# Patient Record
Sex: Female | Born: 1995 | Race: Black or African American | Hispanic: No | Marital: Single | Smoking: Former smoker
Health system: Southern US, Community
[De-identification: ages and names within clinical notes are randomized; demographics above are authoritative.]

## PROBLEM LIST (undated history)

## (undated) ENCOUNTER — Inpatient Hospital Stay (HOSPITAL_COMMUNITY): Payer: Self-pay

## (undated) DIAGNOSIS — K219 Gastro-esophageal reflux disease without esophagitis: Secondary | ICD-10-CM

## (undated) DIAGNOSIS — K259 Gastric ulcer, unspecified as acute or chronic, without hemorrhage or perforation: Secondary | ICD-10-CM

## (undated) DIAGNOSIS — A749 Chlamydial infection, unspecified: Secondary | ICD-10-CM

## (undated) HISTORY — PX: MYRINGOTOMY: SUR874

---
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
## 2013-03-31 ENCOUNTER — Emergency Department (HOSPITAL_COMMUNITY)
Admission: EM | Admit: 2013-03-31 | Discharge: 2013-03-31 | Disposition: A | Discharge status: Home / Self Care | Payer: Medicaid Other | Attending: Emergency Medicine | Admitting: Emergency Medicine

## 2013-03-31 ENCOUNTER — Encounter (HOSPITAL_COMMUNITY): Payer: Self-pay | Admitting: Adult Health

## 2013-03-31 DIAGNOSIS — R51 Headache: Secondary | ICD-10-CM | POA: Insufficient documentation

## 2013-03-31 DIAGNOSIS — Z8719 Personal history of other diseases of the digestive system: Secondary | ICD-10-CM | POA: Insufficient documentation

## 2013-03-31 DIAGNOSIS — Z8679 Personal history of other diseases of the circulatory system: Secondary | ICD-10-CM | POA: Insufficient documentation

## 2013-03-31 DIAGNOSIS — Z87891 Personal history of nicotine dependence: Secondary | ICD-10-CM | POA: Insufficient documentation

## 2013-03-31 MED ORDER — IBUPROFEN 800 MG PO TABS
800.0000 mg | ORAL_TABLET | Freq: Once | ORAL | Status: AC
  Administered 2013-03-31: 800 mg via ORAL
  Filled 2013-03-31: qty 1

## 2013-03-31 MED ORDER — IBUPROFEN 800 MG PO TABS: 400.0000 mg | ORAL_TABLET | Freq: Four times a day (QID) | ORAL | Status: DC | PRN

## 2013-03-31 NOTE — ED Notes (Signed)
Presents with 10 days of intermittent headaches in the frontal lobe. Nothing makes pain better, nothing makes pain worse. Pain desribed as throbbing. Pt alert, oriented, maeX4. Denies nausea.

## 2013-03-31 NOTE — ED Notes (Signed)
Pt returned from radiology, scan cancelled.

## 2013-03-31 NOTE — ED Provider Notes (Signed)
History     CSN: [id number]  Arrival date & time 03/31/13  1718   First MD Initiated Contact with Patient 03/31/13 2028      Chief Complaint  Patient presents with  . Headache    (Consider location/radiation/quality/duration/timing/severity/associated sxs/prior treatment) HPI Comments: Patient is a 17 year old female who presents for intermittent headaches x 1 week. Patient states her headaches are throbbing in nature, without thunderclap onset, and nonradiating. Throbbing sensation is present in her bilateral temples without any aggravating or alleviating factors. Patient admits to taking Excedrin, Tylenol, and aspirin without relief. She denies fevers, vision changes, difficulty swallowing or speaking, neck pain or stiffness, photophobia, phonophobia, chest pain, shortness of breath, nausea or vomiting, numbness or tingling in her extremities, weakness or inability to ambulate, and recent head injury or trauma.  The history is provided by the patient. No language interpreter was used.    Past Medical History  Diagnosis Date  . Hypotension   . GERD (gastroesophageal reflux disease)   . Gastric ulcer     Past Surgical History  Procedure Laterality Date  . Myringotomy      History reviewed. No pertinent family history.  History  Substance Use Topics  . Smoking status: Former Games developer  . Smokeless tobacco: Not on file  . Alcohol Use: No    OB History   Grav Para Term Preterm Abortions TAB SAB Ect Mult Living                  Review of Systems  Neurological: Positive for headaches.  All other systems reviewed and are negative.   Allergies  Review of patient's allergies indicates no known allergies.  Home Medications   Current Outpatient Rx  Name  Route  Sig  Dispense  Refill  . ibuprofen (ADVIL,MOTRIN) 800 MG tablet   Oral   Take 0.5 tablets (400 mg total) by mouth every 6 (six) hours as needed for pain.   21 tablet   0     BP 108/64  Pulse 62  Temp(Src)  97.8 F (36.6 C) (Oral)  Resp 14  SpO2 98%  Physical Exam  Nursing note and vitals reviewed. Constitutional: She is oriented to person, place, and time. She appears well-developed and well-nourished. No distress.  Patient is calm and well appearing, in no acute distress. Patient states that pain is not bad at this time and that she did not require pain medicine. Patient laughing at her boyfriend's jokes.  HENT:  Head: Normocephalic and atraumatic.  Mouth/Throat: Oropharynx is clear and moist. No oropharyngeal exudate.  Eyes: Conjunctivae and EOM are normal. Pupils are equal, round, and reactive to light. No scleral icterus.  Neck: Normal range of motion. Neck supple.  Cardiovascular: Normal rate, regular rhythm, normal heart sounds and intact distal pulses.   Pulmonary/Chest: Effort normal and breath sounds normal. No respiratory distress. She has no wheezes. She has no rales.  Abdominal: Soft. Bowel sounds are normal. She exhibits no distension. There is no tenderness. There is no rebound and no guarding.  Musculoskeletal: Normal range of motion. She exhibits no edema and no tenderness.  Lymphadenopathy:    She has no cervical adenopathy.  Neurological: She is alert and oriented to person, place, and time. She has normal reflexes.  Cranial nerves II through XII grossly intact. Patient exhibits equal grip strength bilaterally with 5 out of 5 strength against resistance in her upper and lower extremities. No sensory or motor deficits appreciated. DTRs normal and symmetric. Rapid alternating movements  intact.  Skin: Skin is warm and dry. No rash noted. She is not diaphoretic. No erythema. No pallor.  Psychiatric: She has a normal mood and affect. Her behavior is normal.    ED Course  Procedures (including critical care time)  Labs Reviewed - No data to display No results found.   1. Headache     MDM  Patient presents for intermittent throbbing headaches times one week without  thunderclap onset. On physical exam patient is well and nontoxic appearing, laughing at her boyfriend's jokes, and in no discomfort. No focal neurologic deficits appreciated on physical exam; patient exhibits equal grip strength with normal strength against resistance, normal gait, symmetric reflexes, and normal RAMs. Patient is afebrile and there is no nuchal rigidity to suspect meningitis. Very low suspicion for hemorrhage as the cause of symptoms given patient's age, symptom duration, lack of neurologic deficits, and lack of head injury or trauma.   Patient given ibuprofen and ED with relief of symptoms. Patient is hemodynamically stable and appropriate for discharge with primary care followup; patient endorses relationship with a PCP. Patient given ibuprofen to take as needed for headaches and advised to refrain from drinking sugary drinks and to get between 6-8 hours of sleep a night. Indications for ED return discussed. Patient states comfort and understanding with this discharge plan with no unaddressed concerns.        Antony Madura, PA-C 04/04/13 [phone]

## 2013-03-31 NOTE — ED Notes (Addendum)
Pt c/o constant HA x 1.5 weeks. Pt sts she gets headaches often but usually never lasting this long. Sometimes HA is on one side other on both sides, throbbing feeling. Pt denies photophobia, noise making pain worse. Pt denies new medications. Pt reports she has tried tylenol and Excedrin, pt sts not much relief with either of those. Pt reports when she was smaller she would take Claritin for her headaches and it worked. Pt denies nasal drainage. C/o watery eyes X 3 days. Pt in nad, skin warm and dry, resp e/u.

## 2013-03-31 NOTE — ED Notes (Signed)
FIRST ROUNDS : PT. SITTING AT WAITING AREA WITH NO DISTRESS, RESPIRATIONS UNLABORED , NURSE EXPLAINED DELAY AND PROCESS.

## 2013-04-06 NOTE — ED Provider Notes (Signed)
Medical screening examination/treatment/procedure(s) were performed by non-physician practitioner and as supervising physician I was immediately available for consultation/collaboration.  Margret Moat L Tajanay Hurley, MD 04/06/13 0712 

## 2013-06-04 ENCOUNTER — Encounter (HOSPITAL_COMMUNITY): Payer: Self-pay | Admitting: Emergency Medicine

## 2013-06-04 ENCOUNTER — Emergency Department (INDEPENDENT_AMBULATORY_CARE_PROVIDER_SITE_OTHER)
Admission: EM | Admit: 2013-06-04 | Discharge: 2013-06-04 | Disposition: A | Discharge status: Home / Self Care | Payer: Medicaid Other | Source: Home / Self Care | Attending: Family Medicine | Admitting: Family Medicine

## 2013-06-04 ENCOUNTER — Other Ambulatory Visit (HOSPITAL_COMMUNITY)
Admission: RE | Admit: 2013-06-04 | Discharge: 2013-06-04 | Disposition: A | Discharge status: Home / Self Care | Payer: Medicaid Other | Source: Ambulatory Visit | Attending: Family Medicine | Admitting: Family Medicine

## 2013-06-04 DIAGNOSIS — Z113 Encounter for screening for infections with a predominantly sexual mode of transmission: Secondary | ICD-10-CM | POA: Insufficient documentation

## 2013-06-04 DIAGNOSIS — N760 Acute vaginitis: Secondary | ICD-10-CM | POA: Insufficient documentation

## 2013-06-04 DIAGNOSIS — N898 Other specified noninflammatory disorders of vagina: Secondary | ICD-10-CM

## 2013-06-04 DIAGNOSIS — K2970 Gastritis, unspecified, without bleeding: Secondary | ICD-10-CM

## 2013-06-04 DIAGNOSIS — R109 Unspecified abdominal pain: Secondary | ICD-10-CM

## 2013-06-04 DIAGNOSIS — K2990 Gastroduodenitis, unspecified, without bleeding: Secondary | ICD-10-CM

## 2013-06-04 HISTORY — DX: Chlamydial infection, unspecified: A74.9

## 2013-06-04 LAB — COMPREHENSIVE METABOLIC PANEL WITH GFR
ALT: 23 U/L (ref 0–35)
AST: 21 U/L (ref 0–37)
Albumin: 3.9 g/dL (ref 3.5–5.2)
Alkaline Phosphatase: 48 U/L (ref 39–117)
BUN: 9 mg/dL (ref 6–23)
CO2: 26 meq/L (ref 19–32)
Calcium: 9.3 mg/dL (ref 8.4–10.5)
Chloride: 103 meq/L (ref 96–112)
Creatinine, Ser: 0.79 mg/dL (ref 0.50–1.10)
GFR calc Af Amer: >90 mL/min
GFR calc non Af Amer: >90 mL/min
Glucose, Bld: 82 mg/dL (ref 70–99)
Potassium: 3.7 meq/L (ref 3.5–5.1)
Sodium: 137 meq/L (ref 135–145)
Total Bilirubin: 0.2 mg/dL — ABNORMAL LOW (ref 0.3–1.2)
Total Protein: 7.3 g/dL (ref 6.0–8.3)

## 2013-06-04 LAB — POCT URINALYSIS DIP (DEVICE)
Bilirubin Urine: NEGATIVE
Glucose, UA: NEGATIVE mg/dL
Hgb urine dipstick: NEGATIVE
Ketones, ur: NEGATIVE mg/dL
Nitrite: NEGATIVE
Protein, ur: NEGATIVE mg/dL
Specific Gravity, Urine: 1.01 (ref 1.005–1.030)
Urobilinogen, UA: 0.2 mg/dL (ref 0.0–1.0)
pH: 7 (ref 5.0–8.0)

## 2013-06-04 LAB — POCT PREGNANCY, URINE: Preg Test, Ur: NEGATIVE

## 2013-06-04 MED ORDER — ALUM & MAG HYDROXIDE-SIMETH 400-400-40 MG/5ML PO SUSP: 10.0000 mL | Freq: Four times a day (QID) | ORAL | Status: DC | PRN

## 2013-06-04 MED ORDER — METRONIDAZOLE 500 MG PO TABS: 500.0000 mg | ORAL_TABLET | Freq: Two times a day (BID) | ORAL | Status: DC

## 2013-06-04 MED ORDER — OMEPRAZOLE 20 MG PO CPDR: 20.0000 mg | DELAYED_RELEASE_CAPSULE | Freq: Every day | ORAL | Status: DC

## 2013-06-04 NOTE — ED Notes (Signed)
Patient in gown and equipment at beds

## 2013-06-04 NOTE — ED Provider Notes (Signed)
History    CSN: [id number] Arrival date & time 06/04/13  1138  First MD Initiated Contact with Patient 06/04/13 1347     Chief Complaint  Patient presents with  . Abdominal Pain   (Consider location/radiation/quality/duration/timing/severity/associated sxs/prior Treatment) HPI Comments: Patient presents complaining of left upper quadrant abdominal pain constant for about the past week. The pain is dull with intermittent stabbing sensation that is localized to the left upper quadrant only without any radiation or associated symptoms.  She reports that eating makes this pain worse. She has had this pain intermittently before in the past and has been diagnosed with a stomach ulcer. Denies any dark stools, nausea, vomiting, hematemesis, or abdominal pain elsewhere. She feels the pain is tight and stabbing that alternates with a bloated feeling. Also, she is complaining of some thin white vaginal discharge that she thinks may be normal but she wants to make sure. She was diagnosed with Chlamydia one month ago, treated by her primary care physician. She denies any pelvic pain,  dysuria, vaginal irritation. She is sexually active.  Patient is a 17 y.o. female presenting with abdominal pain.  Abdominal Pain Associated symptoms include abdominal pain. Pertinent negatives include no chest pain and no shortness of breath.   Past Medical History  Diagnosis Date  . Hypotension   . GERD (gastroesophageal reflux disease)   . Gastric ulcer   . Chlamydia    Past Surgical History  Procedure Laterality Date  . Myringotomy     No family history on file. History  Substance Use Topics  . Smoking status: Former Games developer  . Smokeless tobacco: Not on file  . Alcohol Use: No   OB History   Grav Para Term Preterm Abortions TAB SAB Ect Mult Living                 Review of Systems  Constitutional: Negative for fever and chills.  Eyes: Negative for visual disturbance.  Respiratory: Negative for cough  and shortness of breath.   Cardiovascular: Negative for chest pain, palpitations and leg swelling.  Gastrointestinal: Positive for abdominal pain, constipation and abdominal distention. Negative for nausea, vomiting, diarrhea, blood in stool, anal bleeding and rectal pain.  Endocrine: Negative for polydipsia and polyuria.  Genitourinary: Positive for vaginal discharge. Negative for dysuria, urgency, frequency, hematuria, flank pain, decreased urine volume, vaginal bleeding, difficulty urinating, genital sores, vaginal pain, pelvic pain and dyspareunia.  Musculoskeletal: Negative for myalgias and arthralgias.  Skin: Negative for rash.  Neurological: Negative for dizziness, weakness and light-headedness.    Allergies  Review of patient's allergies indicates no known allergies.  Home Medications   Current Outpatient Rx  Name  Route  Sig  Dispense  Refill  . alum & mag hydroxide-simeth (MAALOX PLUS) 400-400-40 MG/5ML suspension   Oral   Take 10 mLs by mouth every 6 (six) hours as needed for indigestion.   360 mL   0   . ibuprofen (ADVIL,MOTRIN) 800 MG tablet   Oral   Take 0.5 tablets (400 mg total) by mouth every 6 (six) hours as needed for pain.   21 tablet   0   . metroNIDAZOLE (FLAGYL) 500 MG tablet   Oral   Take 1 tablet (500 mg total) by mouth 2 (two) times daily.   14 tablet   0   . omeprazole (PRILOSEC) 20 MG capsule   Oral   Take 1 capsule (20 mg total) by mouth daily.   30 capsule   2  BP 115/65  Pulse 63  Temp(Src) 98.3 F (36.8 C) (Oral)  Resp 24  SpO2 100%  LMP 04/04/2013 Physical Exam  Nursing note and vitals reviewed. Constitutional: She is oriented to person, place, and time. Vital signs are normal. She appears well-developed and well-nourished. No distress.  HENT:  Head: Atraumatic.  Eyes: EOM are normal. Pupils are equal, round, and reactive to light.  Pulmonary/Chest: Effort normal.  Abdominal: There is tenderness in the right lower quadrant,  epigastric area and left upper quadrant.  Genitourinary: Vaginal discharge (thin white) found.  Neurological: She is alert and oriented to person, place, and time. She has normal strength.  Skin: Skin is warm and dry. She is not diaphoretic.  Psychiatric: She has a normal mood and affect. Her behavior is normal. Judgment normal.    ED Course  Procedures (including critical care time) Labs Reviewed  COMPREHENSIVE METABOLIC PANEL - Abnormal; Notable for the following:    Total Bilirubin 0.2 (*)    All other components within normal limits  POCT URINALYSIS DIP (DEVICE) - Abnormal; Notable for the following:    Leukocytes, UA TRACE (*)    All other components within normal limits  URINE CULTURE  POCT PREGNANCY, URINE  POCT PREGNANCY, URINE  POCT H PYLORI SCREEN  CERVICOVAGINAL ANCILLARY ONLY   No results found. 1. Gastritis   2. Abdominal pain   3. Vaginal discharge     MDM  I believe this patient has gastritis. The vital signs are normal and the pain is moderate. The abdominal exam is benign. Labs are normal as well. Treat with Maalox symptomatically and will start her on omeprazole daily, she will followup with her primary care physician. If the pain gets worse she will go to the emergency department. Vaginal discharge is thin white and malodorous, appears to be bacterial vaginosis. Swabs were sent, will start Flagyl.   Meds ordered this encounter  Medications  . omeprazole (PRILOSEC) 20 MG capsule    Sig: Take 1 capsule (20 mg total) by mouth daily.    Dispense:  30 capsule    Refill:  2  . metroNIDAZOLE (FLAGYL) 500 MG tablet    Sig: Take 1 tablet (500 mg total) by mouth 2 (two) times daily.    Dispense:  14 tablet    Refill:  0  . alum & mag hydroxide-simeth (MAALOX PLUS) 400-400-40 MG/5ML suspension    Sig: Take 10 mLs by mouth every 6 (six) hours as needed for indigestion.    Dispense:  360 mL    Refill:  0     Graylon Good, PA-C 06/04/13 2101

## 2013-06-04 NOTE — ED Notes (Signed)
Reports intermittent abdominal pain for one week, no nausea, no vomiting, no diarrhea.  Reports eating makes abdominal pain worse.  Vague, not certain patient has noticed change in urination or vaginal discharge: for both sets of questions patient seemed to be indecisive.  Patient reports last bowel movement was Tuesday and somewhat hard stool per patient

## 2013-06-05 NOTE — ED Notes (Addendum)
Affirm: Gardnerella pos.., Candida and Trich neg., Urine culture pending. Pt. adequately treated with Flagyl. Regina Shaffer 06/05/2013 GC/Chlamydia neg., Urine culture: 30,000 colonies Multiple bacterial types none predominant. 06/08/2013

## 2013-06-05 NOTE — ED Provider Notes (Signed)
Medical screening examination/treatment/procedure(s) were performed by non-physician practitioner and as supervising physician I was immediately available for consultation/collaboration.   MORENO-COLL,Skyra Crichlow; MD  Malory Spurr Moreno-Coll, MD 06/05/13 0758 

## 2013-06-06 LAB — URINE CULTURE

## 2014-11-16 ENCOUNTER — Encounter (HOSPITAL_COMMUNITY): Payer: Self-pay | Admitting: Emergency Medicine

## 2014-11-16 ENCOUNTER — Emergency Department (HOSPITAL_COMMUNITY)
Admission: EM | Admit: 2014-11-16 | Discharge: 2014-11-16 | Disposition: A | Discharge status: Home / Self Care | Payer: Medicaid Other | Attending: Emergency Medicine | Admitting: Emergency Medicine

## 2014-11-16 DIAGNOSIS — Z8619 Personal history of other infectious and parasitic diseases: Secondary | ICD-10-CM | POA: Insufficient documentation

## 2014-11-16 DIAGNOSIS — Z79899 Other long term (current) drug therapy: Secondary | ICD-10-CM | POA: Diagnosis not present

## 2014-11-16 DIAGNOSIS — K219 Gastro-esophageal reflux disease without esophagitis: Secondary | ICD-10-CM | POA: Insufficient documentation

## 2014-11-16 DIAGNOSIS — R519 Headache, unspecified: Secondary | ICD-10-CM

## 2014-11-16 DIAGNOSIS — R51 Headache: Secondary | ICD-10-CM | POA: Insufficient documentation

## 2014-11-16 DIAGNOSIS — Z8679 Personal history of other diseases of the circulatory system: Secondary | ICD-10-CM | POA: Insufficient documentation

## 2014-11-16 DIAGNOSIS — Z87891 Personal history of nicotine dependence: Secondary | ICD-10-CM | POA: Diagnosis not present

## 2014-11-16 MED ORDER — METOCLOPRAMIDE HCL 5 MG/ML IJ SOLN
10.0000 mg | Freq: Once | INTRAMUSCULAR | Status: AC
  Administered 2014-11-16: 10 mg via INTRAVENOUS
  Filled 2014-11-16: qty 2

## 2014-11-16 MED ORDER — DIPHENHYDRAMINE HCL 50 MG/ML IJ SOLN
25.0000 mg | Freq: Once | INTRAMUSCULAR | Status: AC
  Administered 2014-11-16: 25 mg via INTRAVENOUS
  Filled 2014-11-16: qty 1

## 2014-11-16 MED ORDER — KETOROLAC TROMETHAMINE 30 MG/ML IJ SOLN
30.0000 mg | Freq: Once | INTRAMUSCULAR | Status: AC
  Administered 2014-11-16: 30 mg via INTRAVENOUS
  Filled 2014-11-16: qty 1

## 2014-11-16 NOTE — ED Notes (Signed)
Per EMS- Patient has a history of migraines with pain across forehead. Patient has been prescribed meds from her physician, but did not take in the past 2 days because she did not think they were working. Patient c/o right forehead punding today. Patient c/o right knee weakness and right ankle tightness.

## 2014-11-16 NOTE — ED Provider Notes (Signed)
CSN: [id number]     Arrival date & time 11/16/14  1504 History   First MD Initiated Contact with Patient 11/16/14 1510     Chief Complaint  Patient presents with  . Headache     (Consider location/radiation/quality/duration/timing/severity/associated sxs/prior Treatment) HPI Comments: Patient presents today with a chief complaint of headache.  Headache is frontal and does not radiate.  She reports that the headache has been constant since waking up around 8 AM this morning and is gradually worsening.  She has not taken anything for pain prior to arrival.  She denies nausea, vomiting, vision changes, neck pain/stiffness, fever, or chills.  She denies any head injury or trauma.  Headache is similar to the headaches that she has had in the past.  The history is provided by the patient.    Past Medical History  Diagnosis Date  . Hypotension   . GERD (gastroesophageal reflux disease)   . Gastric ulcer   . Chlamydia    Past Surgical History  Procedure Laterality Date  . Myringotomy     No family history on file. History  Substance Use Topics  . Smoking status: Former Games developermoker  . Smokeless tobacco: Not on file  . Alcohol Use: No   OB History    No data available     Review of Systems  All other systems reviewed and are negative.     Allergies  Review of patient's allergies indicates no known allergies.  Home Medications   Prior to Admission medications   Medication Sig Start Date End Date Taking? Authorizing Provider  cyclobenzaprine (FLEXERIL) 5 MG tablet Take 1 tablet by mouth 3 (three) times daily as needed. Headaches 08/16/14  Yes Historical Provider, MD  diclofenac (VOLTAREN) 75 MG EC tablet Take 75 mg by mouth 2 (two) times daily as needed. headaches 08/16/14  Yes Historical Provider, MD  alum & mag hydroxide-simeth (MAALOX PLUS) 400-400-40 MG/5ML suspension Take 10 mLs by mouth every 6 (six) hours as needed for indigestion. Patient not taking: Reported on 11/16/2014  06/04/13   Graylon GoodZachary H Baker, PA-C  ibuprofen (ADVIL,MOTRIN) 800 MG tablet Take 0.5 tablets (400 mg total) by mouth every 6 (six) hours as needed for pain. Patient not taking: Reported on 11/16/2014 03/31/13   Antony MaduraKelly Humes, PA-C  metroNIDAZOLE (FLAGYL) 500 MG tablet Take 1 tablet (500 mg total) by mouth 2 (two) times daily. Patient not taking: Reported on 11/16/2014 06/04/13   Graylon GoodZachary H Baker, PA-C  omeprazole (PRILOSEC) 20 MG capsule Take 1 capsule (20 mg total) by mouth daily. Patient not taking: Reported on 11/16/2014 06/04/13   Graylon GoodZachary H Baker, PA-C   BP 103/55 mmHg  Pulse 86  Temp(Src) 98.2 F (36.8 C) (Oral)  Resp 18  SpO2 98% Physical Exam  Constitutional: She appears well-developed and well-nourished.  HENT:  Head: Normocephalic and atraumatic.  Mouth/Throat: Oropharynx is clear and moist.  Eyes: EOM are normal. Pupils are equal, round, and reactive to light.  Neck: Normal range of motion. Neck supple.  Cardiovascular: Normal rate, regular rhythm and normal heart sounds.   Pulmonary/Chest: Effort normal and breath sounds normal.  Musculoskeletal: Normal range of motion.  Neurological: She is alert. She has normal strength. No cranial nerve deficit or sensory deficit. Coordination and gait normal.  Normal rapid alternating movements Normal finger to nose testing Normal gait, no ataxia  Skin: Skin is warm and dry.  Psychiatric: She has a normal mood and affect.  Nursing note and vitals reviewed.   ED Course  Procedures (including critical care time) Labs Review Labs Reviewed - No data to display  Imaging Review No results found.   EKG Interpretation None     4:45 PM Reassessed patient.  Headache improved. MDM   Final diagnoses:  None   Pt HA treated and improved while in ED.  Presentation is like pts typical HA and non concerning for [facility], ICH, Meningitis, or temporal arteritis. Pt is afebrile with no focal neuro deficits, nuchal rigidity, or change in vision. Pt is  to follow up with Neurology to discuss prophylactic medication. Pt verbalizes understanding and is agreeable with plan to dc.       Santiago GladHeather Lizzie An, PA-C 11/17/14 1519  Rolland PorterMark James, MD 11/25/14 [phone]

## 2014-11-16 NOTE — ED Notes (Signed)
Bed: [id number] Expected date:  Expected time:  Means of arrival:  Comments: EMS- 18yo F, headache, Hx of same

## 2015-09-13 ENCOUNTER — Emergency Department (HOSPITAL_COMMUNITY)
Admission: EM | Admit: 2015-09-13 | Discharge: 2015-09-13 | Disposition: A | Discharge status: Home / Self Care | Payer: Medicaid Other | Attending: Emergency Medicine | Admitting: Emergency Medicine

## 2015-09-13 ENCOUNTER — Encounter (HOSPITAL_COMMUNITY): Payer: Self-pay

## 2015-09-13 DIAGNOSIS — Z8679 Personal history of other diseases of the circulatory system: Secondary | ICD-10-CM | POA: Insufficient documentation

## 2015-09-13 DIAGNOSIS — Z87891 Personal history of nicotine dependence: Secondary | ICD-10-CM | POA: Insufficient documentation

## 2015-09-13 DIAGNOSIS — E669 Obesity, unspecified: Secondary | ICD-10-CM | POA: Insufficient documentation

## 2015-09-13 DIAGNOSIS — Z8619 Personal history of other infectious and parasitic diseases: Secondary | ICD-10-CM | POA: Insufficient documentation

## 2015-09-13 DIAGNOSIS — H109 Unspecified conjunctivitis: Secondary | ICD-10-CM

## 2015-09-13 DIAGNOSIS — Z8719 Personal history of other diseases of the digestive system: Secondary | ICD-10-CM | POA: Insufficient documentation

## 2015-09-13 DIAGNOSIS — J029 Acute pharyngitis, unspecified: Secondary | ICD-10-CM

## 2015-09-13 LAB — RAPID STREP SCREEN (MED CTR MEBANE ONLY): STREPTOCOCCUS, GROUP A SCREEN (DIRECT): NEGATIVE

## 2015-09-13 MED ORDER — SULFACETAMIDE SODIUM 10 % OP SOLN: 1.0000 [drp] | OPHTHALMIC | Status: DC

## 2015-09-13 MED ORDER — OMEPRAZOLE 20 MG PO CPDR: 20.0000 mg | DELAYED_RELEASE_CAPSULE | Freq: Every day | ORAL | Status: DC

## 2015-09-13 NOTE — ED Notes (Signed)
Patient states she has been having a sore throat for the past 3 weeks.  States she has been taking BC's and hot tea with honey and lemon with no relief.  Patient states productive cough of yellow mucous, blood tinged.  Patient also states left eye pain and irritation, states green drainage out of left eye since yesterday.  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  Patients states she was placed on Diamox, but has only taken 2 tablets "because I forgot about it".

## 2015-09-13 NOTE — ED Provider Notes (Signed)
CSN: [id number]     Arrival date & time 09/13/15  0303 History   First MD Initiated Contact with Patient 09/13/15 [phone]     Chief Complaint  Patient presents with  . Sore Throat  . Eye Pain     (Consider location/radiation/quality/duration/timing/severity/associated sxs/prior Treatment) HPI The patient reports she's had a sore throat from his 3 weeks. It is worse at times. She reports especially when she wakes up in the morning she may have a lot of discomfort and pain with swallowing. She reports that she drinks tea through a lot of the day it feels fine, but the next morning when she wakes up it may be very sore again. She denies any difficulty breathing. She reports she is occasionally had some cough. She reports sometimes his mucus in it. She does not have shortness of breath or chest pain. She denies vomiting but she reports she's always had "stomach problems". She reports she doesn't think about it much anymore. She used to take Prilosec but hasn't taken any for over a year. She just started with a red and itchy eye that is draining yesterday. This is the left eye. She reports she has some pre-existing blurring in the vision, the vision has not changed since these symptoms develop. Past Medical History  Diagnosis Date  . Hypotension   . GERD (gastroesophageal reflux disease)   . Gastric ulcer   . Chlamydia    Past Surgical History  Procedure Laterality Date  . Myringotomy     No family history on file. Social History  Substance Use Topics  . Smoking status: Former Games developermoker  . Smokeless tobacco: None  . Alcohol Use: No   OB History    No data available     Review of Systems  10 Systems reviewed and are negative for acute change except as noted in the HPI.   Allergies  Review of patient's allergies indicates no known allergies.  Home Medications   Prior to Admission medications   Medication Sig Start Date End Date Taking? Authorizing Provider   Aspirin-Salicylamide-Caffeine (BC HEADACHE) 325-95-16 MG TABS Take 1 each by mouth every 6 (six) hours as needed (pain).   Yes Historical Provider, MD  alum & mag hydroxide-simeth (MAALOX PLUS) 400-400-40 MG/5ML suspension Take 10 mLs by mouth every 6 (six) hours as needed for indigestion. Patient not taking: Reported on 11/16/2014 06/04/13   Graylon GoodZachary H Baker, PA-C  ibuprofen (ADVIL,MOTRIN) 800 MG tablet Take 0.5 tablets (400 mg total) by mouth every 6 (six) hours as needed for pain. Patient not taking: Reported on 11/16/2014 03/31/13   Antony MaduraKelly Humes, PA-C  metroNIDAZOLE (FLAGYL) 500 MG tablet Take 1 tablet (500 mg total) by mouth 2 (two) times daily. Patient not taking: Reported on 11/16/2014 06/04/13   Graylon GoodZachary H Baker, PA-C  omeprazole (PRILOSEC) 20 MG capsule Take 1 capsule (20 mg total) by mouth daily. Patient not taking: Reported on 11/16/2014 06/04/13   Graylon GoodZachary H Baker, PA-C  omeprazole (PRILOSEC) 20 MG capsule Take 1 capsule (20 mg total) by mouth daily. 09/13/15   Arby BarretteMarcy Muath Hallam, MD  sulfacetamide (BLEPH-10) 10 % ophthalmic solution Place 1-2 drops into the left eye every 4 (four) hours. 09/13/15   Arby BarretteMarcy Terianna Peggs, MD   BP 119/85 mmHg  Pulse 88  Temp(Src) 97.9 F (36.6 C) (Oral)  Resp 16  Ht 5' (1.524 m)  Wt 260 lb (117.935 kg)  BMI 50.78 kg/m2  SpO2 99%  LMP 09/08/2015 Physical Exam  Constitutional: She is oriented to person,  place, and time.  Patient is alert and nontoxic. She is moderately obese. No respiratory distress. Mental status is clear.  HENT:  Head: Normocephalic and atraumatic.  Right Ear: External ear normal.  Left Ear: External ear normal.  Nose: Nose normal.  Mouth/Throat: Oropharynx is clear and moist.  Posterior or first is widely patent. There is no exudate or erythema on the tonsils.  Eyes: EOM are normal. Pupils are equal, round, and reactive to light.  Left conjunctiva is inflamed. Patient has yellow matting in the lashes. There is no proptosis. There is no  periorbital swelling.  Neck: Neck supple.  Neck is supple without lymphadenopathy or mass.  Cardiovascular: Normal rate, regular rhythm, normal heart sounds and intact distal pulses.   Pulmonary/Chest: Effort normal and breath sounds normal.  Abdominal: Soft. Bowel sounds are normal. She exhibits no distension. There is no tenderness.  Musculoskeletal: Normal range of motion.  Neurological: She is alert and oriented to person, place, and time. She has normal strength. No cranial nerve deficit. She exhibits normal muscle tone. Coordination normal. GCS eye subscore is 4. GCS verbal subscore is 5. GCS motor subscore is 6.  Skin: Skin is warm, dry and intact.  Psychiatric: She has a normal mood and affect.    ED Course  Procedures (including critical care time) Labs Review Labs Reviewed  RAPID STREP SCREEN (NOT AT [facility])  CULTURE, GROUP A STREP    Imaging Review No results found. I have personally reviewed and evaluated these images and lab results as part of my medical decision-making.   EKG Interpretation None      MDM   Final diagnoses:  Conjunctivitis, left eye  Pharyngitis   Patient's had 3 weeks of sore throat. She notes sometimes is worse in the morning. It is improved by taking liquids through the day. Her throat exam is normal. By history the patient likely has GERD. She has not been on any treatment for that for over a year. With intermittent sore throat that is perceived predominantly in the hypopharyngeal region, I felt first course of treatment should be to reinstitute her Prilosec. Patient is counseled on recheck within the next couple weeks to see if this improves her symptoms. She is counseled on possible follow-up with ENT if symptoms are persisting despite this treatment. She incidentally has contacted by this which appears to be bacterial that started within the past 1-2 days. She'll be treated with Bleph-10 for this.    Arby Barrette, MD 09/13/15 [phone]

## 2015-09-13 NOTE — Discharge Instructions (Signed)
Bacterial Conjunctivitis Bacterial conjunctivitis, commonly called pink eye, is an inflammation of the clear membrane that covers the white part of the eye (conjunctiva). The inflammation can also happen on the underside of the eyelids. The blood vessels in the conjunctiva become inflamed, causing the eye to become red or pink. Bacterial conjunctivitis may spread easily from one eye to another and from person to person (contagious).  CAUSES  Bacterial conjunctivitis is caused by bacteria. The bacteria may come from your own skin, your upper respiratory tract, or from someone else with bacterial conjunctivitis. SYMPTOMS  The normally white color of the eye or the underside of the eyelid is usually pink or red. The pink eye is usually associated with irritation, tearing, and some sensitivity to light. Bacterial conjunctivitis is often associated with a thick, yellowish discharge from the eye. The discharge may turn into a crust on the eyelids overnight, which causes your eyelids to stick together. If a discharge is present, there may also be some blurred vision in the affected eye. DIAGNOSIS  Bacterial conjunctivitis is diagnosed by your caregiver through an eye exam and the symptoms that you report. Your caregiver looks for changes in the surface tissues of your eyes, which may point to the specific type of conjunctivitis. A sample of any discharge may be collected on a cotton-tip swab if you have a severe case of conjunctivitis, if your cornea is affected, or if you keep getting repeat infections that do not respond to treatment. The sample will be sent to a lab to see if the inflammation is caused by a bacterial infection and to see if the infection will respond to antibiotic medicines. TREATMENT   Bacterial conjunctivitis is treated with antibiotics. Antibiotic eyedrops are most often used. However, antibiotic ointments are also available. Antibiotics pills are sometimes used. Artificial tears or eye  washes may ease discomfort. HOME CARE INSTRUCTIONS   To ease discomfort, apply a cool, clean washcloth to your eye for 10-20 minutes, 3-4 times a day.  Gently wipe away any drainage from your eye with a warm, wet washcloth or a cotton ball.  Wash your hands often with soap and water. Use paper towels to dry your hands.  Do not share towels or washcloths. This may spread the infection.  Change or wash your pillowcase every day.  You should not use eye makeup until the infection is gone.  Do not operate machinery or drive if your vision is blurred.  Stop using contact lenses. Ask your caregiver how to sterilize or replace your contacts before using them again. This depends on the type of contact lenses that you use.  When applying medicine to the infected eye, do not touch the edge of your eyelid with the eyedrop bottle or ointment tube. SEEK IMMEDIATE MEDICAL CARE IF:   Your infection has not improved within 3 days after beginning treatment.  You had yellow discharge from your eye and it returns.  You have increased eye pain.  Your eye redness is spreading.  Your vision becomes blurred.  You have a fever or persistent symptoms for more than 2-3 days.  You have a fever and your symptoms suddenly get worse.  You have facial pain, redness, or swelling. MAKE SURE YOU:   Understand these instructions.  Will watch your condition.  Will get help right away if you are not doing well or get worse.   This information is not intended to replace advice given to you by your health care provider. Make sure you   discuss any questions you have with your health care provider.   Document Released: 11/12/2005 Document Revised: 12/03/2014 Document Reviewed: 04/14/2012 Elsevier Interactive Patient Education 2016 Elsevier Inc.  Pharyngitis Pharyngitis is redness, pain, and swelling (inflammation) of your pharynx.  CAUSES  Pharyngitis is usually caused by infection. Most of the time,  these infections are from viruses (viral) and are part of a cold. However, sometimes pharyngitis is caused by bacteria (bacterial). Pharyngitis can also be caused by allergies. Viral pharyngitis may be spread from person to person by coughing, sneezing, and personal items or utensils (cups, forks, spoons, toothbrushes). Bacterial pharyngitis may be spread from person to person by more intimate contact, such as kissing.  SIGNS AND SYMPTOMS  Symptoms of pharyngitis include:   Sore throat.   Tiredness (fatigue).   Low-grade fever.   Headache.  Joint pain and muscle aches.  Skin rashes.  Swollen lymph nodes.  Plaque-like film on throat or tonsils (often seen with bacterial pharyngitis). DIAGNOSIS  Your health care provider will ask you questions about your illness and your symptoms. Your medical history, along with a physical exam, is often all that is needed to diagnose pharyngitis. Sometimes, a rapid strep test is done. Other lab tests may also be done, depending on the suspected cause.  TREATMENT  Viral pharyngitis will usually get better in 3-4 days without the use of medicine. Bacterial pharyngitis is treated with medicines that kill germs (antibiotics).  HOME CARE INSTRUCTIONS   Drink enough water and fluids to keep your urine clear or pale yellow.   Only take over-the-counter or prescription medicines as directed by your health care provider:   If you are prescribed antibiotics, make sure you finish them even if you start to feel better.   Do not take aspirin.   Get lots of rest.   Gargle with 8 oz of salt water ( tsp of salt per 1 qt of water) as often as every 1-2 hours to soothe your throat.   Throat lozenges (if you are not at risk for choking) or sprays may be used to soothe your throat. SEEK MEDICAL CARE IF:   You have large, tender lumps in your neck.  You have a rash.  You cough up green, yellow-brown, or bloody spit. SEEK IMMEDIATE MEDICAL CARE IF:     Your neck becomes stiff.  You drool or are unable to swallow liquids.  You vomit or are unable to keep medicines or liquids down.  You have severe pain that does not go away with the use of recommended medicines.  You have trouble breathing (not caused by a stuffy nose). MAKE SURE YOU:   Understand these instructions.  Will watch your condition.  Will get help right away if you are not doing well or get worse.   This information is not intended to replace advice given to you by your health care provider. Make sure you discuss any questions you have with your health care provider.   Document Released: 11/12/2005 Document Revised: 09/02/2013 Document Reviewed: 07/20/2013 Elsevier Interactive Patient Education 2016 Elsevier Inc. Gastroesophageal Reflux Disease, Adult Normally, food travels down the esophagus and stays in the stomach to be digested. However, when a person has gastroesophageal reflux disease (GERD), food and stomach acid move back up into the esophagus. When this happens, the esophagus becomes sore and inflamed. Over time, GERD can create small holes (ulcers) in the lining of the esophagus.  CAUSES This condition is caused by a problem with the muscle between the  esophagus and the stomach (lower esophageal sphincter, or LES). Normally, the LES muscle closes after food passes through the esophagus to the stomach. When the LES is weakened or abnormal, it does not close properly, and that allows food and stomach acid to go back up into the esophagus. The LES can be weakened by certain dietary substances, medicines, and medical conditions, including:  Tobacco use.  Pregnancy.  Having a hiatal hernia.  Heavy alcohol use.  Certain foods and beverages, such as coffee, chocolate, onions, and peppermint. RISK FACTORS This condition is more likely to develop in:  People who have an increased body weight.  People who have connective tissue disorders.  People who use  NSAID medicines. SYMPTOMS Symptoms of this condition include:  Heartburn.  Difficult or painful swallowing.  The feeling of having a lump in the throat.  Abitter taste in the mouth.  Bad breath.  Having a large amount of saliva.  Having an upset or bloated stomach.  Belching.  Chest pain.  Shortness of breath or wheezing.  Ongoing (chronic) cough or a night-time cough.  Wearing away of tooth enamel.  Weight loss. Different conditions can cause chest pain. Make sure to see your health care provider if you experience chest pain. DIAGNOSIS Your health care provider will take a medical history and perform a physical exam. To determine if you have mild or severe GERD, your health care provider may also monitor how you respond to treatment. You may also have other tests, including:  An endoscopy toexamine your stomach and esophagus with a small camera.  A test thatmeasures the acidity level in your esophagus.  A test thatmeasures how much pressure is on your esophagus.  A barium swallow or modified barium swallow to show the shape, size, and functioning of your esophagus. TREATMENT The goal of treatment is to help relieve your symptoms and to prevent complications. Treatment for this condition may vary depending on how severe your symptoms are. Your health care provider may recommend:  Changes to your diet.  Medicine.  Surgery. HOME CARE INSTRUCTIONS Diet  Follow a diet as recommended by your health care provider. This may involve avoiding foods and drinks such as:  Coffee and tea (with or without caffeine).  Drinks that containalcohol.  Energy drinks and sports drinks.  Carbonated drinks or sodas.  Chocolate and cocoa.  Peppermint and mint flavorings.  Garlic and onions.  Horseradish.  Spicy and acidic foods, including peppers, chili powder, curry powder, vinegar, hot sauces, and barbecue sauce.  Citrus fruit juices and citrus fruits, such as  oranges, lemons, and limes.  Tomato-based foods, such as red sauce, chili, salsa, and pizza with red sauce.  Fried and fatty foods, such as donuts, french fries, potato chips, and high-fat dressings.  High-fat meats, such as hot dogs and fatty cuts of red and white meats, such as rib eye steak, sausage, ham, and bacon.  High-fat dairy items, such as whole milk, butter, and cream cheese.  Eat small, frequent meals instead of large meals.  Avoid drinking large amounts of liquid with your meals.  Avoid eating meals during the 2-3 hours before bedtime.  Avoid lying down right after you eat.  Do not exercise right after you eat. General Instructions  Pay attention to any changes in your symptoms.  Take over-the-counter and prescription medicines only as told by your health care provider. Do not take aspirin, ibuprofen, or other NSAIDs unless your health care provider told you to do so.  Do not  use any tobacco products, including cigarettes, chewing tobacco, and e-cigarettes. If you need help quitting, ask your health care provider.  Wear loose-fitting clothing. Do not wear anything tight around your waist that causes pressure on your abdomen.  Raise (elevate) the head of your bed 6 inches (15cm).  Try to reduce your stress, such as with yoga or meditation. If you need help reducing stress, ask your health care provider.  If you are overweight, reduce your weight to an amount that is healthy for you. Ask your health care provider for guidance about a safe weight loss goal.  Keep all follow-up visits as told by your health care provider. This is important. SEEK MEDICAL CARE IF:  You have new symptoms.  You have unexplained weight loss.  You have difficulty swallowing, or it hurts to swallow.  You have wheezing or a persistent cough.  Your symptoms do not improve with treatment.  You have a hoarse voice. SEEK IMMEDIATE MEDICAL CARE IF:  You have pain in your arms, neck,  jaw, teeth, or back.  You feel sweaty, dizzy, or light-headed.  You have chest pain or shortness of breath.  You vomit and your vomit looks like blood or coffee grounds.  You faint.  Your stool is bloody or black.  You cannot swallow, drink, or eat.   This information is not intended to replace advice given to you by your health care provider. Make sure you discuss any questions you have with your health care provider.   Document Released: 08/22/2005 Document Revised: 08/03/2015 Document Reviewed: 03/09/2015 Elsevier Interactive Patient Education [employer]

## 2015-09-16 LAB — CULTURE, GROUP A STREP: STREP A CULTURE: NEGATIVE

## 2016-06-11 ENCOUNTER — Encounter (HOSPITAL_COMMUNITY): Payer: Self-pay | Admitting: Family Medicine

## 2016-06-11 ENCOUNTER — Ambulatory Visit (HOSPITAL_COMMUNITY)
Admission: EM | Admit: 2016-06-11 | Discharge: 2016-06-11 | Disposition: A | Discharge status: Home / Self Care | Payer: Self-pay | Attending: Family Medicine | Admitting: Family Medicine

## 2016-06-11 DIAGNOSIS — N939 Abnormal uterine and vaginal bleeding, unspecified: Secondary | ICD-10-CM

## 2016-06-11 NOTE — ED Provider Notes (Signed)
CSN: [id number]     Arrival date & time 06/11/16  1221 History   First MD Initiated Contact with Patient 06/11/16 1359     Chief Complaint  Patient presents with  . Vaginal Bleeding   (Consider location/radiation/quality/duration/timing/severity/associated sxs/prior Treatment) HPI History obtained from patient:  Pt presents with the cc of:  Vaginal bleeding Duration of symptoms: One month Treatment prior to arrival: None Context: Patient states that a month ago she started with a normal period but it is persistent. She states that it does have added on some days and later on others. She does not think that she is pregnant at this time. Have intercourse with men. Other symptoms include: Occasional pain in the vaginal area Pain score: 1 FAMILY HISTORY: Hypertension    Past Medical History  Diagnosis Date  . Hypotension   . GERD (gastroesophageal reflux disease)   . Gastric ulcer   . Chlamydia    Past Surgical History  Procedure Laterality Date  . Myringotomy     History reviewed. No pertinent family history. Social History  Substance Use Topics  . Smoking status: Former Games developer  . Smokeless tobacco: None  . Alcohol Use: No   OB History    No data available     Review of Systems  Denies: HEADACHE, NAUSEA, ABDOMINAL PAIN, CHEST PAIN, CONGESTION, DYSURIA, SHORTNESS OF BREATH  Allergies  Review of patient's allergies indicates no known allergies.  Home Medications   Prior to Admission medications   Medication Sig Start Date End Date Taking? Authorizing Provider  alum & mag hydroxide-simeth (MAALOX PLUS) 400-400-40 MG/5ML suspension Take 10 mLs by mouth every 6 (six) hours as needed for indigestion. Patient not taking: Reported on 11/16/2014 06/04/13   Graylon Good, PA-C  Aspirin-Salicylamide-Caffeine (BC HEADACHE) [phone] MG TABS Take 1 each by mouth every 6 (six) hours as needed (pain).    Historical Provider, MD  ibuprofen (ADVIL,MOTRIN) 800 MG tablet Take 0.5  tablets (400 mg total) by mouth every 6 (six) hours as needed for pain. Patient not taking: Reported on 11/16/2014 03/31/13   Antony Madura, PA-C  metroNIDAZOLE (FLAGYL) 500 MG tablet Take 1 tablet (500 mg total) by mouth 2 (two) times daily. Patient not taking: Reported on 11/16/2014 06/04/13   Graylon Good, PA-C  omeprazole (PRILOSEC) 20 MG capsule Take 1 capsule (20 mg total) by mouth daily. Patient not taking: Reported on 11/16/2014 06/04/13   Graylon Good, PA-C  omeprazole (PRILOSEC) 20 MG capsule Take 1 capsule (20 mg total) by mouth daily. 09/13/15   Arby Barrette, MD  sulfacetamide (BLEPH-10) 10 % ophthalmic solution Place 1-2 drops into the left eye every 4 (four) hours. 09/13/15   Arby Barrette, MD   Meds Ordered and Administered this Visit  Medications - No data to display  BP 117/61 mmHg  Pulse 80  Temp(Src) 98.4 F (36.9 C) (Oral)  Resp 16  SpO2 99%  LMP 06/11/2016 No data found.   Physical Exam NURSES NOTES AND VITAL SIGNS REVIEWED. CONSTITUTIONAL: Well developed, well nourished, no acute distress HEENT: normocephalic, atraumatic EYES: Conjunctiva normal NECK:normal ROM, supple, no adenopathy PULMONARY:No respiratory distress, normal effort ABDOMINAL: Soft, ND, NT BS+, No CVAT MUSCULOSKELETAL: Normal ROM of all extremities,  SKIN: warm and dry without rash PSYCHIATRIC: Mood and affect, behavior are normal  ED Course  Procedures (including critical care time)  Labs Review Labs Reviewed - No data to display  Imaging Review No results found.   Visual Acuity Review  Right Eye Distance:  Left Eye Distance:   Bilateral Distance:    Right Eye Near:   Left Eye Near:    Bilateral Near:         MDM   1. Vaginal bleeding     Patient is reassured that there are no issues that require transfer to higher level of care at this time or additional tests. Patient is advised to continue home symptomatic treatment. Patient is advised that if there are new  or worsening symptoms to attend the emergency department, contact primary care provider, or return to UC. Instructions of care provided discharged home in stable condition.    THIS NOTE WAS GENERATED USING A VOICE RECOGNITION SOFTWARE PROGRAM. ALL REASONABLE EFFORTS  WERE MADE TO PROOFREAD THIS DOCUMENT FOR ACCURACY.  I have verbally reviewed the discharge instructions with the patient. A printed AVS was given to the patient.  All questions were answered prior to discharge.      Tharon AquasFrank C Patrick, PA 06/11/16 2115

## 2016-06-11 NOTE — ED Notes (Signed)
Pt here for 1 month of vaginal bleeding. sts that some days it is heavy and others light bleeding. sts some intermittent pain. Denies OB. Denies any pain. Denies birth control.

## 2016-06-11 NOTE — Discharge Instructions (Signed)
Abnormal Uterine Bleeding °Abnormal uterine bleeding means bleeding from the vagina that is not your normal menstrual period. This can be: °· Bleeding or spotting between periods. °· Bleeding after sex (sexual intercourse). °· Bleeding that is heavier or more than normal. °· Periods that last longer than usual. °· Bleeding after menopause. °There are many problems that may cause this. Treatment will depend on the cause of the bleeding. Any kind of bleeding that is not normal should be reviewed by your doctor.  °HOME CARE °Watch your condition for any changes. These actions may lessen any discomfort you are having: °· Do not use tampons or douches as told by your doctor. °· Change your pads often. °You should get regular pelvic exams and Pap tests. Keep all appointments for tests as told by your doctor. °GET HELP IF: °· You are bleeding for more than 1 week. °· You feel dizzy at times. °GET HELP RIGHT AWAY IF:  °· You pass out. °· You have to change pads every 15 to 30 minutes. °· You have belly pain. °· You have a fever. °· You become sweaty or weak. °· You are passing large blood clots from the vagina. °· You feel sick to your stomach (nauseous) and throw up (vomit). °MAKE SURE YOU: °· Understand these instructions. °· Will watch your condition. °· Will get help right away if you are not doing well or get worse. °  °This information is not intended to replace advice given to you by your health care provider. Make sure you discuss any questions you have with your health care provider. °  °Document Released: 09/09/2009 Document Revised: 11/17/2013 Document Reviewed: 06/11/2013 °Elsevier Interactive Patient Education ©2016 Elsevier Inc. ° °

## 2016-06-18 ENCOUNTER — Inpatient Hospital Stay (HOSPITAL_COMMUNITY)
Admission: AD | Admit: 2016-06-18 | Discharge: 2016-06-18 | Disposition: A | Discharge status: Home / Self Care | Payer: Medicaid Other | Source: Ambulatory Visit | Attending: Obstetrics and Gynecology | Admitting: Obstetrics and Gynecology

## 2016-06-18 ENCOUNTER — Encounter (HOSPITAL_COMMUNITY): Payer: Self-pay | Admitting: *Deleted

## 2016-06-18 DIAGNOSIS — K219 Gastro-esophageal reflux disease without esophagitis: Secondary | ICD-10-CM | POA: Insufficient documentation

## 2016-06-18 DIAGNOSIS — N938 Other specified abnormal uterine and vaginal bleeding: Secondary | ICD-10-CM | POA: Insufficient documentation

## 2016-06-18 DIAGNOSIS — F17200 Nicotine dependence, unspecified, uncomplicated: Secondary | ICD-10-CM | POA: Insufficient documentation

## 2016-06-18 DIAGNOSIS — Z8711 Personal history of peptic ulcer disease: Secondary | ICD-10-CM | POA: Insufficient documentation

## 2016-06-18 LAB — CBC
HCT: 36.3 % (ref 36.0–46.0)
Hemoglobin: 12.2 g/dL (ref 12.0–15.0)
MCH: 26.1 pg (ref 26.0–34.0)
MCHC: 33.6 g/dL (ref 30.0–36.0)
MCV: 77.7 fL — AB (ref 78.0–100.0)
PLATELETS: 256 10*3/uL (ref 150–400)
RBC: 4.67 MIL/uL (ref 3.87–5.11)
RDW: 14.6 % (ref 11.5–15.5)
WBC: 5.1 10*3/uL (ref 4.0–10.5)

## 2016-06-18 LAB — URINALYSIS, ROUTINE W REFLEX MICROSCOPIC
BILIRUBIN URINE: NEGATIVE
GLUCOSE, UA: NEGATIVE mg/dL
KETONES UR: NEGATIVE mg/dL
LEUKOCYTES UA: NEGATIVE
Nitrite: NEGATIVE
PH: 5 (ref 5.0–8.0)
PROTEIN: NEGATIVE mg/dL
Specific Gravity, Urine: <1.005 — ABNORMAL LOW (ref 1.005–1.030)

## 2016-06-18 LAB — POCT PREGNANCY, URINE: Preg Test, Ur: NEGATIVE

## 2016-06-18 LAB — URINE MICROSCOPIC-ADD ON

## 2016-06-18 MED ORDER — MEGESTROL ACETATE 20 MG PO TABS: 40.0000 mg | ORAL_TABLET | Freq: Two times a day (BID) | ORAL | 1 refills | Status: DC

## 2016-06-18 MED ORDER — MEGESTROL ACETATE 40 MG PO TABS
40.0000 mg | ORAL_TABLET | ORAL | Status: AC
  Administered 2016-06-18: 40 mg via ORAL
  Filled 2016-06-18: qty 1

## 2016-06-18 NOTE — MAU Note (Signed)
Pt c/o vag bleeding x1 month. Tonight much heavier-states she changes tampon every 1-2 hours and passing golf ball sized clots. Denies pain at this time. No birth control-sexually active with female partner. Does not think she is pregnant.

## 2016-06-18 NOTE — Discharge Instructions (Signed)

## 2016-06-18 NOTE — MAU Provider Note (Signed)
History   [id number]   Chief Complaint  Patient presents with  . Vaginal Bleeding    HPI Regina Shaffer is a 20 y.o. female here with report of vaginal bleeding that started a month ago.  Bleeding increased in amount last night.  Reports needing to change tampon every 1-2 hours. Passed a clot reported as golf ball sized.  Denies pelvic pain.  In same sex relationship (not taking birth control).  Denies dizziness, SOB, or lightheadedness.    Patient's last menstrual period was 05/15/2016.  OB History  Gravida Para Term Preterm AB Living  0 0 0 0 0 0  SAB TAB Ectopic Multiple Live Births  0 0 0 0 0        Past Medical History:  Diagnosis Date  . Chlamydia   . Gastric ulcer   . GERD (gastroesophageal reflux disease)   . Hypotension     No family history on file.  Social History   Social History  . Marital status: Single    Spouse name: N/A  . Number of children: N/A  . Years of education: N/A   Social History Main Topics  . Smoking status: Current Every Day Smoker  . Smokeless tobacco: Never Used  . Alcohol use No  . Drug use: No  . Sexual activity: Not Asked   Other Topics Concern  . None   Social History Narrative  . None    No Known Allergies  No current facility-administered medications on file prior to encounter.    Current Outpatient Prescriptions on File Prior to Encounter  Medication Sig Dispense Refill  . alum & mag hydroxide-simeth (MAALOX PLUS) 400-400-40 MG/5ML suspension Take 10 mLs by mouth every 6 (six) hours as needed for indigestion. (Patient not taking: Reported on 11/16/2014) 360 mL 0  . Aspirin-Salicylamide-Caffeine (BC HEADACHE) 325-95-16 MG TABS Take 1 each by mouth every 6 (six) hours as needed (pain).    Marland Kitchen ibuprofen (ADVIL,MOTRIN) 800 MG tablet Take 0.5 tablets (400 mg total) by mouth every 6 (six) hours as needed for pain. (Patient not taking: Reported on 11/16/2014) 21 tablet 0  . metroNIDAZOLE (FLAGYL) 500 MG tablet Take 1  tablet (500 mg total) by mouth 2 (two) times daily. (Patient not taking: Reported on 11/16/2014) 14 tablet 0  . omeprazole (PRILOSEC) 20 MG capsule Take 1 capsule (20 mg total) by mouth daily. (Patient not taking: Reported on 11/16/2014) 30 capsule 2  . omeprazole (PRILOSEC) 20 MG capsule Take 1 capsule (20 mg total) by mouth daily. 30 capsule 0  . sulfacetamide (BLEPH-10) 10 % ophthalmic solution Place 1-2 drops into the left eye every 4 (four) hours. 5 mL 0     Review of Systems  Constitutional: Negative for chills, fever and unexpected weight change.  Gastrointestinal: Negative for abdominal distention, nausea and vomiting.  Genitourinary: Positive for vaginal bleeding. Negative for pelvic pain, vaginal discharge and vaginal pain.  Neurological: Negative for dizziness, light-headedness and headaches.  All other systems reviewed and are negative.    Physical Exam   Vitals:   06/18/16 0433  BP: 130/86  Pulse: 86  Resp: 16  Temp: 98.5 F (36.9 C)  TempSrc: Oral  SpO2: 99%  Weight: 268 lb (121.6 kg)  Height: 5' (1.524 m)    Physical Exam  Constitutional: She is oriented to person, place, and time. She appears well-developed and well-nourished.  HENT:  Head: Normocephalic.  Neck: Normal range of motion. Neck supple.  Cardiovascular: Normal rate, regular rhythm and normal  heart sounds.   Respiratory: Effort normal and breath sounds normal.  GI: Soft. She exhibits no mass. There is tenderness. There is no guarding.  Genitourinary: There is bleeding (negative clots; mod dark red blood) in the vagina.  Neurological: She is alert and oriented to person, place, and time. She has normal reflexes.  Skin: Skin is warm and dry.    MAU Course  Procedures  MDM Megace 40 mg PO  Results for orders placed or performed during the hospital encounter of 06/18/16 (from the past 24 hour(s))  Urinalysis, Routine w reflex microscopic (not at [facility])     Status: Abnormal   Collection Time:  06/18/16  4:37 AM  Result Value Ref Range   Color, Urine YELLOW YELLOW   APPearance CLEAR CLEAR   Specific Gravity, Urine <1.005 (L) 1.005 - 1.030   pH 5.0 5.0 - 8.0   Glucose, UA NEGATIVE NEGATIVE mg/dL   Hgb urine dipstick LARGE (A) NEGATIVE   Bilirubin Urine NEGATIVE NEGATIVE   Ketones, ur NEGATIVE NEGATIVE mg/dL   Protein, ur NEGATIVE NEGATIVE mg/dL   Nitrite NEGATIVE NEGATIVE   Leukocytes, UA NEGATIVE NEGATIVE  Urine microscopic-add on     Status: Abnormal   Collection Time: 06/18/16  4:37 AM  Result Value Ref Range   Squamous Epithelial / LPF 0-5 (A) NONE SEEN   WBC, UA 0-5 0 - 5 WBC/hpf   RBC / HPF 6-30 0 - 5 RBC/hpf   Bacteria, UA FEW (A) NONE SEEN  Pregnancy, urine POC     Status: None   Collection Time: 06/18/16  4:44 AM  Result Value Ref Range   Preg Test, Ur NEGATIVE NEGATIVE  CBC     Status: Abnormal   Collection Time: 06/18/16  5:29 AM  Result Value Ref Range   WBC 5.1 4.0 - 10.5 K/uL   RBC 4.67 3.87 - 5.11 MIL/uL   Hemoglobin 12.2 12.0 - 15.0 g/dL   HCT 16.1 09.6 - 04.5 %   MCV 77.7 (L) 78.0 - 100.0 fL   MCH 26.1 26.0 - 34.0 pg   MCHC 33.6 30.0 - 36.0 g/dL   RDW 40.9 81.1 - 91.4 %   Platelets 256 150 - 400 K/uL     Assessment and Plan  Dysfunctional Uterine Bleeding  Plan: RX Megace 40 mg BID Outpatient pelvic ultrasound Follow-up in 4-6 weeks if no improvement  Walidah N Karim, CNM 06/18/2016 6:14 AM

## 2016-07-02 ENCOUNTER — Ambulatory Visit (HOSPITAL_COMMUNITY): Payer: Medicaid Other

## 2016-07-03 ENCOUNTER — Ambulatory Visit (HOSPITAL_COMMUNITY): Admission: RE | Admit: 2016-07-03 | Payer: Medicaid Other | Source: Ambulatory Visit

## 2016-12-04 ENCOUNTER — Ambulatory Visit (INDEPENDENT_AMBULATORY_CARE_PROVIDER_SITE_OTHER): Payer: Self-pay

## 2016-12-04 ENCOUNTER — Ambulatory Visit (HOSPITAL_COMMUNITY)
Admission: EM | Admit: 2016-12-04 | Discharge: 2016-12-04 | Disposition: A | Discharge status: Home / Self Care | Payer: Self-pay | Attending: Family Medicine | Admitting: Family Medicine

## 2016-12-04 ENCOUNTER — Encounter (HOSPITAL_COMMUNITY): Payer: Self-pay | Admitting: Family Medicine

## 2016-12-04 DIAGNOSIS — J111 Influenza due to unidentified influenza virus with other respiratory manifestations: Secondary | ICD-10-CM

## 2016-12-04 DIAGNOSIS — R69 Illness, unspecified: Secondary | ICD-10-CM

## 2016-12-04 MED ORDER — ACETAMINOPHEN 325 MG PO TABS
ORAL_TABLET | ORAL | Status: AC
Start: 2016-12-04 — End: 2016-12-04
  Filled 2016-12-04: qty 3

## 2016-12-04 MED ORDER — ACETAMINOPHEN 325 MG PO TABS
975.0000 mg | ORAL_TABLET | Freq: Once | ORAL | Status: AC
  Administered 2016-12-04: 975 mg via ORAL

## 2016-12-04 MED ORDER — IPRATROPIUM BROMIDE 0.06 % NA SOLN: 2.0000 | Freq: Four times a day (QID) | NASAL | 1 refills | Status: DC

## 2016-12-04 MED ORDER — GUAIFENESIN-CODEINE 100-10 MG/5ML PO SYRP: 10.0000 mL | ORAL_SOLUTION | Freq: Four times a day (QID) | ORAL | 0 refills | Status: DC | PRN

## 2016-12-04 NOTE — ED Triage Notes (Signed)
Pt here for cough, fever, headache, weakness since yesterday.

## 2016-12-04 NOTE — ED Provider Notes (Signed)
[facility]    CSN: [id number] Arrival date & time: 12/04/16  1744     History   Chief Complaint Chief Complaint  Patient presents with  . Fever  . Cough    HPI Regina Shaffer is a 21 y.o. female.   The history is provided by the patient.  Cough  Cough characteristics:  Productive and harsh Sputum characteristics:  Yellow Severity:  Moderate Duration:  1 day Progression:  Worsening Chronicity:  New Smoker: yes   Context: sick contacts, upper respiratory infection and weather changes   Associated symptoms: rhinorrhea   Associated symptoms: no shortness of breath and no wheezing     Past Medical History:  Diagnosis Date  . Chlamydia   . Gastric ulcer   . GERD (gastroesophageal reflux disease)   . Hypotension     There are no active problems to display for this patient.   Past Surgical History:  Procedure Laterality Date  . MYRINGOTOMY      OB History    Gravida Para Term Preterm AB Living   0 0 0 0 0 0   SAB TAB Ectopic Multiple Live Births   0 0 0 0 0       Home Medications    Prior to Admission medications   Medication Sig Start Date End Date Taking? Authorizing Provider  megestrol (MEGACE) 20 MG tablet Take 2 tablets (40 mg total) by mouth 2 (two) times daily. 06/18/16   Marlis EdelsonWalidah N Karim, CNM    Family History History reviewed. No pertinent family history.  Social History Social History  Substance Use Topics  . Smoking status: Current Every Day Smoker  . Smokeless tobacco: Never Used  . Alcohol use No     Allergies   Patient has no known allergies.   Review of Systems Review of Systems  Constitutional: Negative.   HENT: Positive for congestion, postnasal drip and rhinorrhea.   Respiratory: Positive for cough. Negative for shortness of breath and wheezing.   Cardiovascular: Negative.   Gastrointestinal: Negative.   All other systems reviewed and are negative.    Physical Exam Triage Vital Signs ED Triage Vitals    Enc Vitals Group     BP 12/04/16 1852 115/68     Pulse Rate 12/04/16 1852 (!) 132     Resp 12/04/16 1852 18     Temp 12/04/16 1852 101.6 F (38.7 C)     Temp src --      SpO2 12/04/16 1852 99 %     Weight --      Height --      Head Circumference --      Peak Flow --      Pain Score 12/04/16 1853 10     Pain Loc --      Pain Edu? --      Excl. in GC? --    No data found.   Updated Vital Signs BP 115/68   Pulse (!) 132   Temp 101.6 F (38.7 C)   Resp 18   SpO2 99%   Visual Acuity Right Eye Distance:   Left Eye Distance:   Bilateral Distance:    Right Eye Near:   Left Eye Near:    Bilateral Near:     Physical Exam  Constitutional: She appears well-developed and well-nourished. No distress.  HENT:  Right Ear: External ear normal.  Left Ear: External ear normal.  Nose: Nose normal.  Mouth/Throat: Oropharynx is clear and moist.  Eyes:  Pupils are equal, round, and reactive to light.  Neck: Normal range of motion. Neck supple.  Cardiovascular: Normal rate.   Pulmonary/Chest: Effort normal and breath sounds normal.  Abdominal: Soft. Bowel sounds are normal.  Lymphadenopathy:    She has no cervical adenopathy.  Skin: Skin is warm and dry.  Nursing note and vitals reviewed.    UC Treatments / Results  Labs (all labs ordered are listed, but only abnormal results are displayed) Labs Reviewed - No data to display  EKG  EKG Interpretation None       Radiology No results found. X-rays reviewed and report per radiologist.  Procedures Procedures (including critical care time)  Medications Ordered in UC Medications  acetaminophen (TYLENOL) tablet 975 mg (975 mg Oral Given 12/04/16 1856)     Initial Impression / Assessment and Plan / UC Course  I have reviewed the triage vital signs and the nursing notes.  Pertinent labs & imaging results that were available during my care of the patient were reviewed by me and considered in my medical decision making  (see chart for details).  Clinical Course       Final Clinical Impressions(s) / UC Diagnoses   Final diagnoses:  None    New Prescriptions New Prescriptions   No medications on file     Linna Hoff, MD 12/23/16 2135

## 2017-02-12 ENCOUNTER — Encounter (HOSPITAL_COMMUNITY): Payer: Self-pay | Admitting: *Deleted

## 2017-02-12 ENCOUNTER — Inpatient Hospital Stay (HOSPITAL_COMMUNITY)
Admission: AD | Admit: 2017-02-12 | Discharge: 2017-02-12 | Disposition: A | Discharge status: Home / Self Care | Payer: Medicaid Other | Source: Ambulatory Visit | Attending: Family Medicine | Admitting: Family Medicine

## 2017-02-12 DIAGNOSIS — N939 Abnormal uterine and vaginal bleeding, unspecified: Secondary | ICD-10-CM | POA: Insufficient documentation

## 2017-02-12 DIAGNOSIS — Z3202 Encounter for pregnancy test, result negative: Secondary | ICD-10-CM | POA: Insufficient documentation

## 2017-02-12 DIAGNOSIS — F17210 Nicotine dependence, cigarettes, uncomplicated: Secondary | ICD-10-CM | POA: Insufficient documentation

## 2017-02-12 LAB — URINALYSIS, ROUTINE W REFLEX MICROSCOPIC
BILIRUBIN URINE: NEGATIVE
Glucose, UA: NEGATIVE mg/dL
Ketones, ur: NEGATIVE mg/dL
Leukocytes, UA: NEGATIVE
NITRITE: NEGATIVE
PH: 6 (ref 5.0–8.0)
Protein, ur: NEGATIVE mg/dL
Specific Gravity, Urine: 1.014 (ref 1.005–1.030)
WBC UA: NONE SEEN WBC/hpf (ref 0–5)

## 2017-02-12 LAB — CBC
HCT: 37.8 % (ref 36.0–46.0)
Hemoglobin: 12.7 g/dL (ref 12.0–15.0)
MCH: 26.6 pg (ref 26.0–34.0)
MCHC: 33.6 g/dL (ref 30.0–36.0)
MCV: 79.2 fL (ref 78.0–100.0)
PLATELETS: 282 10*3/uL (ref 150–400)
RBC: 4.77 MIL/uL (ref 3.87–5.11)
RDW: 14.5 % (ref 11.5–15.5)
WBC: 7.2 10*3/uL (ref 4.0–10.5)

## 2017-02-12 LAB — ABO/RH: ABO/RH(D): B POS

## 2017-02-12 LAB — TYPE AND SCREEN
ABO/RH(D): B POS
Antibody Screen: NEGATIVE

## 2017-02-12 LAB — POCT PREGNANCY, URINE: PREG TEST UR: NEGATIVE

## 2017-02-12 MED ORDER — MEGESTROL ACETATE 40 MG PO TABS
80.0000 mg | ORAL_TABLET | Freq: Once | ORAL | Status: AC
  Administered 2017-02-12: 80 mg via ORAL
  Filled 2017-02-12: qty 2

## 2017-02-12 MED ORDER — NORGESTIMATE-ETH ESTRADIOL 0.25-35 MG-MCG PO TABS: ORAL_TABLET | ORAL | 3 refills | Status: DC

## 2017-02-12 NOTE — MAU Provider Note (Signed)
Chief Complaint: Vaginal Bleeding   First Provider Initiated Contact with Patient 02/12/17 1823     SUBJECTIVE HPI: Regina Shaffer is a 21 y.o. G0P0000 at Unknown who presents to [facility]missions reporting heavy, prolonged menstrual bleeding. Has been bleeding for possibly 7 weeks and states bleeding is worsening. Has had 2-3 similar episodes in the past. States her menstrual periods are irregular and usually light. Has not seen a gynecologist for this problem. Per review of Epic patient has been treated with Megace in the past. Wants a long-term solution.  Associated signs and symptoms: Negative for fever, chills, dizziness. Positive for mild cramping. No other history of excessive bleeding.  Past Medical History:  Diagnosis Date  . Chlamydia   . Gastric ulcer   . GERD (gastroesophageal reflux disease)   . Hypotension    OB History  Gravida Para Term Preterm AB Living  0 0 0 0 0 0  SAB TAB Ectopic Multiple Live Births  0 0 0 0 0       Past Surgical History:  Procedure Laterality Date  . MYRINGOTOMY     Social History   Social History  . Marital status: Single    Spouse name: N/A  . Number of children: N/A  . Years of education: N/A   Occupational History  . Not on file.   Social History Main Topics  . Smoking status: Current Every Day Smoker    Packs/day: 1.00    Types: Cigarettes  . Smokeless tobacco: Never Used  . Alcohol use No  . Drug use: No  . Sexual activity: No   Other Topics Concern  . Not on file   Social History Narrative  . No narrative on file   No family history on file. No current facility-administered medications on file prior to encounter.    Current Outpatient Prescriptions on File Prior to Encounter  Medication Sig Dispense Refill  . guaiFENesin-codeine (ROBITUSSIN AC) 100-10 MG/5ML syrup Take 10 mLs by mouth 4 (four) times daily as needed for cough. (Patient not taking: Reported on 02/12/2017) 180 mL 0  . ipratropium (ATROVENT) 0.06  % nasal spray Place 2 sprays into both nostrils 4 (four) times daily. (Patient not taking: Reported on 02/12/2017) 15 mL 1   No Known Allergies  I have reviewed patient's Past Medical Hx, Surgical Hx, Family Hx, Social Hx, medications and allergies.   Review of Systems  Constitutional: Negative for chills, fatigue and fever.  Gastrointestinal: Positive for abdominal pain. Negative for constipation, diarrhea, nausea and vomiting. Abdominal distention: mild low abdominal cramping.  Genitourinary: Positive for vaginal bleeding. Negative for dysuria and vaginal discharge.  Neurological: Negative for dizziness.  Hematological: Does not bruise/bleed easily.    OBJECTIVE Patient Vitals for the past 24 hrs:  BP Temp Temp src Pulse Resp SpO2 Height Weight  02/12/17 1943 (!) 126/50 - - (!) 102 - - - -  02/12/17 1835 - - - 95 - 100 % - -  02/12/17 1734 126/74 97.9 F (36.6 C) Oral (!) 111 16 - 5' (1.524 m) 285 lb (129.3 kg)   Constitutional: Well-developed, well-nourished, Morbidly obese female in no acute distress Skin: No pallor. Cardiovascular: Mild tachycardia. Respiratory: normal rate and effort.  GI: Abd soft, non-tender. Pos BS x 4 MS: Extremities nontender, no edema, normal ROM Neurologic: Alert and oriented x 4.  GU: Neg CVAT.  SPECULUM EXAM: NEFG, physiologic discharge, moderate amount of bright red blood noted, cervix clean, no polyps. Nonfriable.  BIMANUAL: cervix closed;  uterus difficult to assess due to body habitus, but not obviously enlarged, no adnexal tenderness or masses. No CMT.  LAB RESULTS Results for orders placed or performed during the hospital encounter of 02/12/17 (from the past 24 hour(s))  Urinalysis, Routine w reflex microscopic     Status: Abnormal   Collection Time: 02/12/17  5:40 PM  Result Value Ref Range   Color, Urine YELLOW YELLOW   APPearance HAZY (A) CLEAR   Specific Gravity, Urine 1.014 1.005 - 1.030   pH 6.0 5.0 - 8.0   Glucose, UA NEGATIVE  NEGATIVE mg/dL   Hgb urine dipstick LARGE (A) NEGATIVE   Bilirubin Urine NEGATIVE NEGATIVE   Ketones, ur NEGATIVE NEGATIVE mg/dL   Protein, ur NEGATIVE NEGATIVE mg/dL   Nitrite NEGATIVE NEGATIVE   Leukocytes, UA NEGATIVE NEGATIVE   RBC / HPF TOO NUMEROUS TO COUNT 0 - 5 RBC/hpf   WBC, UA NONE SEEN 0 - 5 WBC/hpf   Bacteria, UA RARE (A) NONE SEEN   Squamous Epithelial / LPF 0-5 (A) NONE SEEN  CBC     Status: None   Collection Time: 02/12/17  5:52 PM  Result Value Ref Range   WBC 7.2 4.0 - 10.5 K/uL   RBC 4.77 3.87 - 5.11 MIL/uL   Hemoglobin 12.7 12.0 - 15.0 g/dL   HCT 11.937.8 14.736.0 - 82.946.0 %   MCV 79.2 78.0 - 100.0 fL   MCH 26.6 26.0 - 34.0 pg   MCHC 33.6 30.0 - 36.0 g/dL   RDW 56.214.5 13.011.5 - 86.515.5 %   Platelets 282 150 - 400 K/uL  Type and screen     Status: None   Collection Time: 02/12/17  5:52 PM  Result Value Ref Range   ABO/RH(D) B POS    Antibody Screen NEG    Sample Expiration 02/15/2017   Pregnancy, urine POC     Status: None   Collection Time: 02/12/17  6:27 PM  Result Value Ref Range   Preg Test, Ur NEGATIVE NEGATIVE    IMAGING No results found.  MAU COURSE Orders Placed This Encounter  Procedures  . CBC  . Urinalysis, Routine w reflex microscopic  . Pregnancy, urine POC  . Type and screen  . ABO/Rh  . Discharge patient   Meds ordered this encounter  Medications  . megestrol (MEGACE) tablet 80 mg  . norgestimate-ethinyl estradiol (ORTHO-CYCLEN,SPRINTEC,PREVIFEM) 0.25-35 MG-MCG tablet    Sig: Take 3 pills per day times 3 days, then take 2 pills per day times 3 days, then 1 pill per day.    Dispense:  1 Package    Refill:  3    Order Specific Question:   Supervising Provider    Answer:   Levie HeritageSTINSON, JACOB J [[id number]]    MDM - Menometrorrhagia likely due to anovulatory cycles, possible PCOS. Bleeding stable, hemodynamically stable. Will give dose of Megace in MAU and treat patient with OCP taper. Discussed OCPs for menstrual regulation versus Mirena IUD. Patient will  start with OCPs but is interested in pursuing IUD placement. List of providers given.  ASSESSMENT 1. Abnormal uterine bleeding (AUB)     PLAN Discharge home in stable condition. Bleeding precautions Follow-up Information    Gynecologist of your choice Follow up.        [facility] [place] Follow up.   Why:  for Gynecology emergncies Contact information: [address] [id number] [place] [id number] [phone]         Allergies as of  02/12/2017   No Known Allergies     Medication List    STOP taking these medications   guaiFENesin-codeine 100-10 MG/5ML syrup Commonly known as:  ROBITUSSIN AC   ipratropium 0.06 % nasal spray Commonly known as:  ATROVENT     TAKE these medications   norgestimate-ethinyl estradiol 0.25-35 MG-MCG tablet Commonly known as:  ORTHO-CYCLEN,SPRINTEC,PREVIFEM Take 3 pills per day times 3 days, then take 2 pills per day times 3 days, then 1 pill per day.        [place], [place] 02/12/2017  7:50 PM

## 2017-02-12 NOTE — MAU Note (Signed)
Pt states she has been bleeding for 6-7 weeks, became very heavy last week.  Had bleeding for 3-4 months last year.  Has some mild abd & back pain.

## 2017-02-12 NOTE — Discharge Instructions (Signed)
Abnormal Uterine Bleeding Abnormal uterine bleeding can affect women at various stages in life, including teenagers, women in their reproductive years, pregnant women, and women who have reached menopause. Several kinds of uterine bleeding are considered abnormal, including:  Bleeding or spotting between periods.  Bleeding after sexual intercourse.  Bleeding that is heavier or more than normal.  Periods that last longer than usual.  Bleeding after menopause. Many cases of abnormal uterine bleeding are minor and simple to treat, while others are more serious. Any type of abnormal bleeding should be evaluated by your health care provider. Treatment will depend on the cause of the bleeding. Follow these instructions at home: Monitor your condition for any changes. The following actions may help to alleviate any discomfort you are experiencing:  Avoid the use of tampons and douches as directed by your health care provider.  Change your pads frequently. You should get regular pelvic exams and Pap tests. Keep all follow-up appointments for diagnostic tests as directed by your health care provider. Contact a health care provider if:  Your bleeding lasts more than 1 week.  You feel dizzy at times. Get help right away if:  You pass out.  You are changing pads every 15 to 30 minutes.  You have abdominal pain.  You have a fever.  You become sweaty or weak.  You are passing large blood clots from the vagina.  You start to feel nauseous and vomit. This information is not intended to replace advice given to you by your health care provider. Make sure you discuss any questions you have with your health care provider. Document Released: 11/12/2005 Document Revised: 04/25/2016 Document Reviewed: 06/11/2013 Elsevier Interactive Patient Education  2017 [employer]  Levonorgestrel intrauterine device (IUD) What is this medicine? LEVONORGESTREL IUD (LEE voe nor jes trel) is a  contraceptive (birth control) device. The device is placed inside the uterus by a healthcare professional. It is used to prevent pregnancy. This device can also be used to treat heavy bleeding that occurs during your period. This medicine may be used for other purposes; ask your health care provider or pharmacist if you have questions. COMMON BRAND NAME(S): Cameron AliKyleena, LILETTA, Mirena, Skyla What should I tell my health care provider before I take this medicine? They need to know if you have any of these conditions: -abnormal Pap smear -cancer of the breast, uterus, or cervix -diabetes -endometritis -genital or pelvic infection now or in the past -have more than one sexual partner or your partner has more than one partner -heart disease -history of an ectopic or tubal pregnancy -immune system problems -IUD in place -liver disease or tumor -problems with blood clots or take blood-thinners -seizures -use intravenous drugs -uterus of unusual shape -vaginal bleeding that has not been explained -an unusual or allergic reaction to levonorgestrel, other hormones, silicone, or polyethylene, medicines, foods, dyes, or preservatives -pregnant or trying to get pregnant -breast-feeding How should I use this medicine? This device is placed inside the uterus by a health care professional. Talk to your pediatrician regarding the use of this medicine in children. Special care may be needed. Overdosage: If you think you have taken too much of this medicine contact a poison control center or emergency room at once. NOTE: This medicine is only for you. Do not share this medicine with others. What if I miss a dose? This does not apply. Depending on the brand of device you have inserted, the device will need to be replaced every 3 to 5  years if you wish to continue using this type of birth control. What may interact with this medicine? Do not take this medicine with any of the following  medications: -amprenavir -bosentan -fosamprenavir This medicine may also interact with the following medications: -aprepitant -armodafinil -barbiturate medicines for inducing sleep or treating seizures -bexarotene -boceprevir -griseofulvin -medicines to treat seizures like carbamazepine, ethotoin, felbamate, oxcarbazepine, phenytoin, topiramate -modafinil -pioglitazone -rifabutin -rifampin -rifapentine -some medicines to treat HIV infection like atazanavir, efavirenz, indinavir, lopinavir, nelfinavir, tipranavir, ritonavir -St. John's wort -warfarin This list may not describe all possible interactions. Give your health care provider a list of all the medicines, herbs, non-prescription drugs, or dietary supplements you use. Also tell them if you smoke, drink alcohol, or use illegal drugs. Some items may interact with your medicine. What should I watch for while using this medicine? Visit your doctor or health care professional for regular check ups. See your doctor if you or your partner has sexual contact with others, becomes HIV positive, or gets a sexual transmitted disease. This product does not protect you against HIV infection (AIDS) or other sexually transmitted diseases. You can check the placement of the IUD yourself by reaching up to the top of your vagina with clean fingers to feel the threads. Do not pull on the threads. It is a good habit to check placement after each menstrual period. Call your doctor right away if you feel more of the IUD than just the threads or if you cannot feel the threads at all. The IUD may come out by itself. You may become pregnant if the device comes out. If you notice that the IUD has come out use a backup birth control method like condoms and call your health care provider. Using tampons will not change the position of the IUD and are okay to use during your period. This IUD can be safely scanned with magnetic resonance imaging (MRI) only under  specific conditions. Before you have an MRI, tell your healthcare provider that you have an IUD in place, and which type of IUD you have in place. What side effects may I notice from receiving this medicine? Side effects that you should report to your doctor or health care professional as soon as possible: -allergic reactions like skin rash, itching or hives, swelling of the face, lips, or tongue -fever, flu-like symptoms -genital sores -high blood pressure -no menstrual period for 6 weeks during use -pain, swelling, warmth in the leg -pelvic pain or tenderness -severe or sudden headache -signs of pregnancy -stomach cramping -sudden shortness of breath -trouble with balance, talking, or walking -unusual vaginal bleeding, discharge -yellowing of the eyes or skin Side effects that usually do not require medical attention (report to your doctor or health care professional if they continue or are bothersome): -acne -breast pain -change in sex drive or performance -changes in weight -cramping, dizziness, or faintness while the device is being inserted -headache -irregular menstrual bleeding within first 3 to 6 months of use -nausea This list may not describe all possible side effects. Call your doctor for medical advice about side effects. You may report side effects to FDA at 1-800-FDA-1088. Where should I keep my medicine? This does not apply. NOTE: This sheet is a summary. It may not cover all possible information. If you have questions about this medicine, talk to your doctor, pharmacist, or health care provider.  2018 Elsevier/Gold Standard (2016-08-24 14:14:56)

## 2017-05-30 ENCOUNTER — Other Ambulatory Visit: Payer: Self-pay | Admitting: Advanced Practice Midwife

## 2017-06-24 ENCOUNTER — Encounter (HOSPITAL_COMMUNITY): Payer: Self-pay | Admitting: *Deleted

## 2017-06-24 ENCOUNTER — Emergency Department (HOSPITAL_COMMUNITY)
Admission: EM | Admit: 2017-06-24 | Discharge: 2017-06-25 | Disposition: A | Discharge status: Home / Self Care | Payer: Self-pay | Attending: Emergency Medicine | Admitting: Emergency Medicine

## 2017-06-24 DIAGNOSIS — F17210 Nicotine dependence, cigarettes, uncomplicated: Secondary | ICD-10-CM | POA: Insufficient documentation

## 2017-06-24 DIAGNOSIS — F4325 Adjustment disorder with mixed disturbance of emotions and conduct: Secondary | ICD-10-CM | POA: Insufficient documentation

## 2017-06-24 DIAGNOSIS — F22 Delusional disorders: Secondary | ICD-10-CM | POA: Insufficient documentation

## 2017-06-24 LAB — COMPREHENSIVE METABOLIC PANEL
ALBUMIN: 4.4 g/dL (ref 3.5–5.0)
ALT: 23 U/L (ref 14–54)
ANION GAP: 10 (ref 5–15)
AST: 27 U/L (ref 15–41)
Alkaline Phosphatase: 56 U/L (ref 38–126)
BILIRUBIN TOTAL: 0.4 mg/dL (ref 0.3–1.2)
BUN: 9 mg/dL (ref 6–20)
CO2: 23 mmol/L (ref 22–32)
Calcium: 9.2 mg/dL (ref 8.9–10.3)
Chloride: 106 mmol/L (ref 101–111)
Creatinine, Ser: 1.01 mg/dL — ABNORMAL HIGH (ref 0.44–1.00)
GFR calc Af Amer: >60 mL/min (ref >60)
GLUCOSE: 118 mg/dL — AB (ref 65–99)
POTASSIUM: 3.8 mmol/L (ref 3.5–5.1)
Sodium: 139 mmol/L (ref 135–145)
TOTAL PROTEIN: 8.1 g/dL (ref 6.5–8.1)

## 2017-06-24 LAB — CBC WITH DIFFERENTIAL/PLATELET
BASOS ABS: 0 10*3/uL (ref 0.0–0.1)
BASOS PCT: 0 %
EOS ABS: 0 10*3/uL (ref 0.0–0.7)
Eosinophils Relative: 0 %
HCT: 37.6 % (ref 36.0–46.0)
HEMOGLOBIN: 12.9 g/dL (ref 12.0–15.0)
Lymphocytes Relative: 18 %
Lymphs Abs: 1.6 10*3/uL (ref 0.7–4.0)
MCH: 26.1 pg (ref 26.0–34.0)
MCHC: 34.3 g/dL (ref 30.0–36.0)
MCV: 76.1 fL — ABNORMAL LOW (ref 78.0–100.0)
Monocytes Absolute: 0.8 10*3/uL (ref 0.1–1.0)
Monocytes Relative: 8 %
Neutro Abs: 6.5 10*3/uL (ref 1.7–7.7)
Neutrophils Relative %: 74 %
Platelets: 286 10*3/uL (ref 150–400)
RBC: 4.94 MIL/uL (ref 3.87–5.11)
RDW: 14.5 % (ref 11.5–15.5)
WBC: 8.9 10*3/uL (ref 4.0–10.5)

## 2017-06-24 LAB — ETHANOL

## 2017-06-24 LAB — SALICYLATE LEVEL: Salicylate Lvl: <7 mg/dL (ref 2.8–30.0)

## 2017-06-24 LAB — HCG, QUANTITATIVE, PREGNANCY

## 2017-06-24 LAB — ACETAMINOPHEN LEVEL

## 2017-06-24 MED ORDER — ONDANSETRON HCL 4 MG PO TABS: 4.0000 mg | ORAL_TABLET | Freq: Three times a day (TID) | ORAL | Status: DC | PRN

## 2017-06-24 MED ORDER — ZOLPIDEM TARTRATE 5 MG PO TABS: 5.0000 mg | ORAL_TABLET | Freq: Every evening | ORAL | Status: DC | PRN

## 2017-06-24 MED ORDER — ALUM & MAG HYDROXIDE-SIMETH 200-200-20 MG/5ML PO SUSP: 30.0000 mL | Freq: Four times a day (QID) | ORAL | Status: DC | PRN

## 2017-06-24 MED ORDER — ZIPRASIDONE MESYLATE 20 MG IM SOLR
20.0000 mg | Freq: Once | INTRAMUSCULAR | Status: AC
  Administered 2017-06-24: 20 mg via INTRAMUSCULAR
  Filled 2017-06-24: qty 20

## 2017-06-24 MED ORDER — ACETAMINOPHEN 325 MG PO TABS: 650.0000 mg | ORAL_TABLET | ORAL | Status: DC | PRN

## 2017-06-24 MED ORDER — NICOTINE 21 MG/24HR TD PT24: 21.0000 mg | MEDICATED_PATCH | Freq: Every day | TRANSDERMAL | Status: DC

## 2017-06-24 MED ORDER — STERILE WATER FOR INJECTION IJ SOLN
INTRAMUSCULAR | Status: AC
  Administered 2017-06-24: 10:00:00
  Filled 2017-06-24: qty 10

## 2017-06-24 NOTE — ED Notes (Signed)
Bed: [facility]42 Expected date:  Expected time:  Means of arrival:  Comments: Hold for room 14 

## 2017-06-24 NOTE — ED Notes (Signed)
Introduced self to patient. Pt oriented to unit expectations.  Assessed pt for:  A) Anxiety &/or agitation: On admission to the [facility] pt is calm and cooperative. Her affect is flat and she appears depressed. She admits to having suicidal thoughts sometimes, especially when, "I am taking something."  She would not share what she was taking last night, or did not remember. She appears to be somewhat guarded.   S) Safety: Safety maintained with q-15-minute checks and hourly rounds by staff.  A) ADLs: Pt able to perform ADLs independently.  P) Pick-Up (room cleanliness): Pt's room clean and free of clutter.

## 2017-06-24 NOTE — ED Notes (Signed)
Unable to collect labs at this time PT refuse labs. PT state "I am done do not want anything done" RN have been made aware

## 2017-06-24 NOTE — ED Notes (Signed)
Pt refuses EKG. Pt states that she does not want any help and that "she is done here and does not need our help".

## 2017-06-24 NOTE — ED Notes (Signed)
Pt trying to elope. Security and mother bringing patient peacefully  Back to room. Sitter at bedside.

## 2017-06-24 NOTE — ED Notes (Signed)
Pt was agreeable to taking geodon shot.

## 2017-06-24 NOTE — ED Notes (Signed)
Pt refusing to change into burgandy scrubs at this time. MD aware. Mother remains at bedside.
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
## 2017-06-24 NOTE — ED Notes (Signed)
Pt in room sleeping.  Earlier Pt has visitor (sister).  Pt is quiet and soft spoken. Pt's sister is asking about d/c.  Advised pt to sign consent form to release information before anything would be discussed with sister.  Sister concerned about Pt property and cell phone and is upset that pt does not have a phone in her room. Pt and sister request d/c this evening.  Notes advise that pt will be reassessed in the am and d/c is denied.  Pt still remains IVC.   Pt denies pain or discomfort.  Pt denies SI, HI or AVH.  Pt refuses any snacks and has his meal still at bedside. Pt is calm and makes good eye contact. Pt offered support and encouragement  Pt remains safe on unit.
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
## 2017-06-24 NOTE — ED Notes (Addendum)
Dr. Criss AlvineGoldston at bedside. Pt allowed mother to come back. Pt very paranoid at this time.

## 2017-06-24 NOTE — ED Notes (Signed)
Pt refusing labs and EKG again. MD at bedside speaking with mother and pt again.

## 2017-06-24 NOTE — Progress Notes (Signed)
06/24/2017 Approximately 1:52pm Intern went to offer activities to pt, but pt was asleep.  Marvell Fullerachel Meyer, Recreation Therapy Intern   Caroll RancherMarjette Varetta Shaffer, LRT/CTRS
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
## 2017-06-24 NOTE — ED Notes (Signed)
Pt changed into paper scrubs and wanded by security. Ambulatory to [facility]. One bag of belongings sent with pt.

## 2017-06-24 NOTE — ED Notes (Signed)
ED Provider at bedside. Mother remains at bedside.

## 2017-06-24 NOTE — BH Assessment (Signed)
[facility] Assessment Progress Note   Case was staffed with Lord DNP who recommended patient be re-evaluated in the a.m.    

## 2017-06-25 DIAGNOSIS — F4325 Adjustment disorder with mixed disturbance of emotions and conduct: Secondary | ICD-10-CM

## 2017-06-25 MED ORDER — GUAIFENESIN ER 600 MG PO TB12: 600.0000 mg | ORAL_TABLET | Freq: Two times a day (BID) | ORAL | 0 refills | Status: DC

## 2017-06-25 MED ORDER — GUAIFENESIN ER 600 MG PO TB12
600.0000 mg | ORAL_TABLET | Freq: Two times a day (BID) | ORAL | Status: DC
  Administered 2017-06-25: 600 mg via ORAL
  Filled 2017-06-25: qty 1

## 2017-06-25 NOTE — Consult Note (Signed)
[facility] Face-to-Face Psychiatry Consult   Reason for Consult:  Psychosis from illicit drug Referring Physician:  EDP Patient Identification: Regina Shaffer MRN:  [id number] Principal Diagnosis: Adjustment disorder with mixed disturbance of emotions and conduct Diagnosis:   Patient Active Problem List   Diagnosis Date Noted  . Adjustment disorder with mixed disturbance of emotions and conduct [F43.25] 06/25/2017    Priority: High    Total Time spent with patient: 45 minutes  Subjective:   Regina Shaffer is a 21 y.o. female patient does not warrant admission.  HPI:  21 yo female who came to the ED after using a "pink pill" at a club when partying.  She started acting bizarre and felt so bad she said she had wanted to die.  Today, she is clear and coherent with no suicidal/homicidal ideations, "I have learned my lesson."  No hallucinations or withdrawal.  She is agreeable to counseling, no past psychiatric experience except some counseling years ago.  Stable for discharge.  Past Psychiatric History: none  Risk to Self: None Risk to Others: Homicidal Ideation: No Thoughts of Harm to Others: No Current Homicidal Intent: No Current Homicidal Plan: No Access to Homicidal Means: No Identified Victim: NA History of harm to others?: No Assessment of Violence: None Noted Violent Behavior Description: NA Does patient have access to weapons?: No Criminal Charges Pending?: No Does patient have a court date: No Prior Inpatient Therapy: Prior Inpatient Therapy: No Prior Therapy Dates:  (NA) Prior Therapy Facilty/Provider(s):  ([facility]) Reason for Treatment:  (NA) Prior Outpatient Therapy: Prior Outpatient Therapy: No Prior Therapy Dates: NA Prior Therapy Facilty/Provider(s): NA Reason for Treatment: NA Does patient have an ACCT team?: No Does patient have Intensive In-House Services?  : No Does patient have [facility] services? : Unknown Does patient have [facility] services?: No  Past Medical  History:  Past Medical History:  Diagnosis Date  . Chlamydia   . Gastric ulcer   . GERD (gastroesophageal reflux disease)   . Hypotension     Past Surgical History:  Procedure Laterality Date  . MYRINGOTOMY     Family History: No family history on file. Family Psychiatric  History: none Social History:  History  Alcohol Use No     History  Drug Use No    Social History   Social History  . Marital status: Single    Spouse name: N/A  . Number of children: N/A  . Years of education: N/A   Social History Main Topics  . Smoking status: Current Every Day Smoker    Packs/day: 1.00    Types: Cigarettes  . Smokeless tobacco: Never Used  . Alcohol use No  . Drug use: No  . Sexual activity: No   Other Topics Concern  . None   Social History Narrative  . None   Additional Social History:    Allergies:  No Known Allergies  Labs:  Results for orders placed or performed during the hospital encounter of 06/24/17 (from the past 48 hour(s))  Acetaminophen level     Status: Abnormal   Collection Time: 06/24/17  9:32 AM  Result Value Ref Range   Acetaminophen (Tylenol), Serum <10 (L) 10 - 30 ug/mL    Comment:        THERAPEUTIC CONCENTRATIONS VARY SIGNIFICANTLY. A RANGE OF 10-30 ug/mL MAY BE AN EFFECTIVE CONCENTRATION FOR MANY PATIENTS. HOWEVER, SOME ARE BEST TREATED AT CONCENTRATIONS OUTSIDE THIS RANGE. ACETAMINOPHEN CONCENTRATIONS >150 ug/mL AT 4 HOURS AFTER INGESTION AND >50 ug/mL AT  12 HOURS AFTER INGESTION ARE OFTEN ASSOCIATED WITH TOXIC REACTIONS.   Comprehensive metabolic panel     Status: Abnormal   Collection Time: 06/24/17  9:32 AM  Result Value Ref Range   Sodium 139 135 - 145 mmol/L   Potassium 3.8 3.5 - 5.1 mmol/L   Chloride 106 101 - 111 mmol/L   CO2 23 22 - 32 mmol/L   Glucose, Bld 118 (H) 65 - 99 mg/dL   BUN 9 6 - 20 mg/dL   Creatinine, Ser 1.01 (H) 0.44 - 1.00 mg/dL   Calcium 9.2 8.9 - 10.3 mg/dL   Total Protein 8.1 6.5 - 8.1 g/dL    Albumin 4.4 3.5 - 5.0 g/dL   AST 27 15 - 41 U/L   ALT 23 14 - 54 U/L   Alkaline Phosphatase 56 38 - 126 U/L   Total Bilirubin 0.4 0.3 - 1.2 mg/dL   GFR calc non Af Amer >60 >60 mL/min   GFR calc Af Amer >60 >60 mL/min    Comment: (NOTE) The eGFR has been calculated using the CKD EPI equation. This calculation has not been validated in all clinical situations. eGFR's persistently <60 mL/min signify possible Chronic Kidney Disease.    Anion gap 10 5 - 15  Ethanol     Status: None   Collection Time: 06/24/17  9:32 AM  Result Value Ref Range   Alcohol, Ethyl (B) <5 <5 mg/dL    Comment:        LOWEST DETECTABLE LIMIT FOR SERUM ALCOHOL IS 5 mg/dL FOR MEDICAL PURPOSES ONLY   Salicylate level     Status: None   Collection Time: 06/24/17  9:32 AM  Result Value Ref Range   Salicylate Lvl <5.7 2.8 - 30.0 mg/dL  CBC with Differential     Status: Abnormal   Collection Time: 06/24/17  9:32 AM  Result Value Ref Range   WBC 8.9 4.0 - 10.5 K/uL   RBC 4.94 3.87 - 5.11 MIL/uL   Hemoglobin 12.9 12.0 - 15.0 g/dL   HCT 37.6 36.0 - 46.0 %   MCV 76.1 (L) 78.0 - 100.0 fL   MCH 26.1 26.0 - 34.0 pg   MCHC 34.3 30.0 - 36.0 g/dL   RDW 14.5 11.5 - 15.5 %   Platelets 286 150 - 400 K/uL   Neutrophils Relative % 74 %   Neutro Abs 6.5 1.7 - 7.7 K/uL   Lymphocytes Relative 18 %   Lymphs Abs 1.6 0.7 - 4.0 K/uL   Monocytes Relative 8 %   Monocytes Absolute 0.8 0.1 - 1.0 K/uL   Eosinophils Relative 0 %   Eosinophils Absolute 0.0 0.0 - 0.7 K/uL   Basophils Relative 0 %   Basophils Absolute 0.0 0.0 - 0.1 K/uL  hCG, quantitative, pregnancy     Status: None   Collection Time: 06/24/17  9:32 AM  Result Value Ref Range   hCG, Beta Chain, Quant, S <1 <5 mIU/mL    Comment:          GEST. AGE      CONC.  (mIU/mL)   <=1 WEEK        5 - 50     2 WEEKS       50 - 500     3 WEEKS       100 - 10,000     4 WEEKS     1,000 - 30,000     5 WEEKS     3,500 - 115,000  6-8 WEEKS     12,000 - 270,000    12 WEEKS      15,000 - 220,000        FEMALE AND NON-PREGNANT FEMALE:     LESS THAN 5 mIU/mL     Current Facility-Administered Medications  Medication Dose Route Frequency Provider Last Rate Last Dose  . acetaminophen (TYLENOL) tablet 650 mg  650 mg Oral Q4H PRN Sherwood Gambler, MD      . alum & mag hydroxide-simeth (MAALOX/MYLANTA) 200-200-20 MG/5ML suspension 30 mL  30 mL Oral Q6H PRN Sherwood Gambler, MD      . guaiFENesin (MUCINEX) 12 hr tablet 600 mg  600 mg Oral BID Patrecia Pour, NP      . nicotine (NICODERM CQ - dosed in mg/24 hours) patch 21 mg  21 mg Transdermal Daily Sherwood Gambler, MD      . ondansetron [facility]) tablet 4 mg  4 mg Oral Q8H PRN Sherwood Gambler, MD       Current Outpatient Prescriptions  Medication Sig Dispense Refill  . SPRINTEC 28 0.25-35 MG-MCG tablet take 3 tablets by mouth PER DAY for 3 days then 2 tablets PER DAY for 3 days then 1 tablet once daily (Patient not taking: Reported on 06/24/2017) 28 tablet 3    Musculoskeletal: Strength & Muscle Tone: within normal limits Gait & Station: normal Patient leans: N/A  Psychiatric Specialty Exam: Physical Exam  Constitutional: She is oriented to person, place, and time. She appears well-developed and well-nourished.  HENT:  Head: Normocephalic.  Neck: Normal range of motion.  Respiratory: Effort normal.  Musculoskeletal: Normal range of motion.  Neurological: She is alert and oriented to person, place, and time.  Psychiatric: She has a normal mood and affect. Her speech is normal and behavior is normal. Judgment and thought content normal. Cognition and memory are normal.    Review of Systems  Psychiatric/Behavioral: Positive for substance abuse.  All other systems reviewed and are negative.   Blood pressure (!) 148/93, pulse 81, temperature 98.6 F (37 C), temperature source Axillary, resp. rate 17, SpO2 100 %.There is no height or weight on file to calculate BMI.  General Appearance: Casual  Eye Contact:  Good   Speech:  Normal Rate  Volume:  Normal  Mood:  Euthymic  Affect:  Congruent  Thought Process:  Coherent and Descriptions of Associations: Intact  Orientation:  Full (Time, Place, and Person)  Thought Content:  WDL and Logical  Suicidal Thoughts:  No  Homicidal Thoughts:  No  Memory:  Immediate;   Good Recent;   Good Remote;   Good  Judgement:  Fair  Insight:  Fair  Psychomotor Activity:  Normal  Concentration:  Concentration: Good and Attention Span: Good  Recall:  Good  Fund of Knowledge:  Good  Language:  Good  Akathisia:  No  Handed:  Right  AIMS (if indicated):     Assets:  Housing Leisure Time Physical Health Resilience Social Support  ADL's:  Intact  Cognition:  WNL  Sleep:        Treatment Plan Summary: Daily contact with patient to assess and evaluate symptoms and progress in treatment, Medication management and Plan substance abuse:  -Crisis stabilization -Medication management:  None started as she needed to clear -Individual and substance abuse counseling  Disposition: No evidence of imminent risk to self or others at present.    Waylan Boga, NP 06/25/2017 10:56 AM  Patient seen face-to-face for psychiatric evaluation, chart reviewed and case discussed  with the physician extender and developed treatment plan. Reviewed the information documented and agree with the treatment plan. Corena Pilgrim, MD

## 2017-06-25 NOTE — BH Assessment (Signed)
[facility] Assessment Progress Note  Per Thedore MinsMojeed Akintayo, MD, this pt does not require psychiatric hospitalization at this time.  Pt presents under IVC initiated by EDP Pricilla LovelessScott Goldston, MD, which Dr Jannifer FranklinAkintayo has rescinded.  [facility].  Discharge instructions advise pt to [facility] or [facility]  Pt's nurse, Diane, has been notified.  Doylene Canninghomas Devan Babino, MA Triage Specialist [phone]

## 2017-06-25 NOTE — [facility] Suicide Risk Assessment (Signed)
Suicide Risk Assessment  Discharge Assessment   [facility]   Principal Problem: Adjustment disorder with mixed disturbance of emotions and conduct Discharge Diagnoses:  Patient Active Problem List   Diagnosis Date Noted  . Adjustment disorder with mixed disturbance of emotions and conduct [F43.25] 06/25/2017    Priority: High    Total Time spent with patient: 45 minutes   Musculoskeletal: Strength & Muscle Tone: within normal limits Gait & Station: normal Patient leans: N/A  Psychiatric Specialty Exam: Physical Exam  Constitutional: She is oriented to person, place, and time. She appears well-developed and well-nourished.  HENT:  Head: Normocephalic.  Neck: Normal range of motion.  Respiratory: Effort normal.  Musculoskeletal: Normal range of motion.  Neurological: She is alert and oriented to person, place, and time.  Psychiatric: She has a normal mood and affect. Her speech is normal and behavior is normal. Judgment and thought content normal. Cognition and memory are normal.    Review of Systems  Psychiatric/Behavioral: Positive for substance abuse.  All other systems reviewed and are negative.   Blood pressure (!) 148/93, pulse 81, temperature 98.6 F (37 C), temperature source Axillary, resp. rate 17, SpO2 100 %.There is no height or weight on file to calculate BMI.  General Appearance: Casual  Eye Contact:  Good  Speech:  Normal Rate  Volume:  Normal  Mood:  Euthymic  Affect:  Congruent  Thought Process:  Coherent and Descriptions of Associations: Intact  Orientation:  Full (Time, Place, and Person)  Thought Content:  WDL and Logical  Suicidal Thoughts:  No  Homicidal Thoughts:  No  Memory:  Immediate;   Good Recent;   Good Remote;   Good  Judgement:  Fair  Insight:  Fair  Psychomotor Activity:  Normal  Concentration:  Concentration: Good and Attention Span: Good  Recall:  Good  Fund of Knowledge:  Good  Language:  Good   Akathisia:  No  Handed:  Right  AIMS (if indicated):     Assets:  Housing Leisure Time Physical Health Resilience Social Support  ADL's:  Intact  Cognition:  WNL  Sleep:       Mental Status Per Nursing Assessment::   On Admission:   substance abuse   Demographic Factors:  Adolescent or young adult  Loss Factors: NA  Historical Factors: NA  Risk Reduction Factors:   Sense of responsibility to family, Living with another person, especially a relative and Positive social support  Continued Clinical Symptoms:  None  Cognitive Features That Contribute To Risk:  None    Suicide Risk:  Minimal: No identifiable suicidal ideation.  Patients presenting with no risk factors but with morbid ruminations; may be classified as minimal risk based on the severity of the depressive symptoms    Plan Of Care/Follow-up recommendations:  Activity:  as tolerated Diet:  heart healthy diet  Sydnee Lamour, NP 06/25/2017, 5:18 PM

## 2017-06-25 NOTE — Discharge Instructions (Signed)
For your behavioral health needs, you are advised to follow up with one of the following providers.  Contact them at your earliest opportunity to ask about scheduling an intake appointment: ° °     [facility] °     [address] °     [place], [place] [address] °     [phone] °     New patients are seen at the walk-in clinic every Tuesday from 9:00 am - 12:00 pm ° °     [facility] °     [address] °     [place], [place] [address] °     [phone] °     New patients are seen at their walk-in clinic.  Walk-in hours are Monday - Friday from 8:00 am - 12:00 pm, and from 1:00 pm - 3:00 pm.  Walk-in patients are seen on a first come, first served basis, so try to arrive as early as possible for the best chance of being seen the same day.  There is an initial fee of $22.50. °

## 2017-06-25 NOTE — ED Notes (Signed)
Introduced self to pt.   Assessed pt for:  A) Anxiety and/or agitation:  Patient is calm and cooperative. Denies SI/HI/AVH. Affect bright, smiling on approach, would like to be discharged.  S) Safety: Safety maintained with q-15-min checks and hourly rounding.   A) ADL:  Pt able to perform ADLs independently.   P) Room Cleanliness: Pt's room clean and free of clutter.

## 2017-06-25 NOTE — ED Notes (Signed)
Pt discharged home, all belongings returned to pt who signed for same, denies SI/HI/AVH.  Discharge instructions read to pt who verbalized understanding. Pt not responding to internal stimuli, pt escorted to the emergency department exit.

## 2017-09-09 ENCOUNTER — Ambulatory Visit (INDEPENDENT_AMBULATORY_CARE_PROVIDER_SITE_OTHER): Payer: Medicaid Other | Admitting: Obstetrics

## 2017-09-09 ENCOUNTER — Other Ambulatory Visit (HOSPITAL_COMMUNITY)
Admission: RE | Admit: 2017-09-09 | Discharge: 2017-09-09 | Disposition: A | Discharge status: Home / Self Care | Payer: Medicaid Other | Source: Ambulatory Visit | Attending: Obstetrics | Admitting: Obstetrics

## 2017-09-09 ENCOUNTER — Encounter: Payer: Self-pay | Admitting: Obstetrics

## 2017-09-09 VITALS — BP 120/80 | HR 94 | Ht 60.0 in | Wt 280.2 lb

## 2017-09-09 DIAGNOSIS — Z6841 Body Mass Index (BMI) 40.0 and over, adult: Secondary | ICD-10-CM

## 2017-09-09 DIAGNOSIS — Z01419 Encounter for gynecological examination (general) (routine) without abnormal findings: Secondary | ICD-10-CM | POA: Insufficient documentation

## 2017-09-09 DIAGNOSIS — Z3009 Encounter for other general counseling and advice on contraception: Secondary | ICD-10-CM

## 2017-09-09 DIAGNOSIS — N898 Other specified noninflammatory disorders of vagina: Secondary | ICD-10-CM

## 2017-09-09 DIAGNOSIS — F17210 Nicotine dependence, cigarettes, uncomplicated: Secondary | ICD-10-CM

## 2017-09-09 DIAGNOSIS — Z113 Encounter for screening for infections with a predominantly sexual mode of transmission: Secondary | ICD-10-CM

## 2017-09-09 DIAGNOSIS — Z3049 Encounter for surveillance of other contraceptives: Secondary | ICD-10-CM | POA: Diagnosis not present

## 2017-09-09 DIAGNOSIS — Z30011 Encounter for initial prescription of contraceptive pills: Secondary | ICD-10-CM

## 2017-09-09 MED ORDER — LO LOESTRIN FE 1 MG-10 MCG / 10 MCG PO TABS: 1.0000 | ORAL_TABLET | Freq: Every day | ORAL | 11 refills | Status: DC

## 2017-09-09 NOTE — Patient Instructions (Addendum)
Oral Contraception Information Oral contraceptive pills (OCPs) are medicines taken to prevent pregnancy. OCPs work by preventing the ovaries from releasing eggs. The hormones in OCPs also cause the cervical mucus to thicken, preventing the sperm from entering the uterus. The hormones also cause the uterine lining to become thin, not allowing a fertilized egg to attach to the inside of the uterus. OCPs are highly effective when taken exactly as prescribed. However, OCPs do not prevent sexually transmitted diseases (STDs). Safe sex practices, such as using condoms along with the pill, can help prevent STDs. Before taking the pill, you may have a physical exam and Pap test. Your health care provider may order blood tests. The health care provider will make sure you are a good candidate for oral contraception. Discuss with your health care provider the possible side effects of the OCP you may be prescribed. When starting an OCP, it can take 2 to 3 months for the body to adjust to the changes in hormone levels in your body. Types of oral contraception  The combination pill-This pill contains estrogen and progestin (synthetic progesterone) hormones. The combination pill comes in 21-day, 28-day, or 91-day packs. Some types of combination pills are meant to be taken continuously (365-day pills). With 21-day packs, you do not take pills for 7 days after the last pill. With 28-day packs, the pill is taken every day. The last 7 pills are without hormones. Certain types of pills have more than 21 hormone-containing pills. With 91-day packs, the first 84 pills contain both hormones, and the last 7 pills contain no hormones or contain estrogen only.  The minipill-This pill contains the progesterone hormone only. The pill is taken every day continuously. It is very important to take the pill at the same time each day. The minipill comes in packs of 28 pills. All 28 pills contain the hormone. Advantages of oral  contraceptive pills  Decreases premenstrual symptoms.  Treats menstrual period cramps.  Regulates the menstrual cycle.  Decreases a heavy menstrual flow.  May treatacne, depending on the type of pill.  Treats abnormal uterine bleeding.  Treats polycystic ovarian syndrome.  Treats endometriosis.  Can be used as emergency contraception. Things that can make oral contraceptive pills less effective OCPs can be less effective if:  You forget to take the pill at the same time every day.  You have a stomach or intestinal disease that lessens the absorption of the pill.  You take OCPs with other medicines that make OCPs less effective, such as antibiotics, certain HIV medicines, and some seizure medicines.  You take expired OCPs.  You forget to restart the pill on day 7, when using the packs of 21 pills.  Risks associated with oral contraceptive pills Oral contraceptive pills can sometimes cause side effects, such as:  Headache.  Nausea.  Breast tenderness.  Irregular bleeding or spotting.  Combination pills are also associated with a small increased risk of:  Blood clots.  Heart attack.  Stroke.  This information is not intended to replace advice given to you by your health care provider. Make sure you discuss any questions you have with your health care provider. Document Released: 02/02/2003 Document Revised: 04/19/2016 Document Reviewed: 05/03/2013 Elsevier Interactive Patient Education  2018 [employer]  Oral Contraception Use Oral contraceptive pills (OCPs) are medicines taken to prevent pregnancy. OCPs work by preventing the ovaries from releasing eggs. The hormones in OCPs also cause the cervical mucus to thicken, preventing the sperm from entering the uterus. The  hormones also cause the uterine lining to become thin, not allowing a fertilized egg to attach to the inside of the uterus. OCPs are highly effective when taken exactly as prescribed. However,  OCPs do not prevent sexually transmitted diseases (STDs). Safe sex practices, such as using condoms along with an OCP, can help prevent STDs. Before taking OCPs, you may have a physical exam and Pap test. Your health care provider may also order blood tests if necessary. Your health care provider will make sure you are a good candidate for oral contraception. Discuss with your health care provider the possible side effects of the OCP you may be prescribed. When starting an OCP, it can take 2 to 3 months for the body to adjust to the changes in hormone levels in your body. How to take oral contraceptive pills Your health care provider may advise you on how to start taking the first cycle of OCPs. Otherwise, you can:  Start on day 1 of your menstrual period. You will not need any backup contraceptive protection with this start time.  Start on the first Sunday after your menstrual period or the day you get your prescription. In these cases, you will need to use backup contraceptive protection for the first week.  Start the pill at any time of your cycle. If you take the pill within 5 days of the start of your period, you are protected against pregnancy right away. In this case, you will not need a backup form of birth control. If you start at any other time of your menstrual cycle, you will need to use another form of birth control for 7 days. If your OCP is the type called a minipill, it will protect you from pregnancy after taking it for 2 days (48 hours).  After you have started taking OCPs:  If you forget to take 1 pill, take it as soon as you remember. Take the next pill at the regular time.  If you miss 2 or more pills, call your health care provider because different pills have different instructions for missed doses. Use backup birth control until your next menstrual period starts.  If you use a 28-day pack that contains inactive pills and you miss 1 of the last 7 pills (pills with no  hormones), it will not matter. Throw away the rest of the non-hormone pills and start a new pill pack.  No matter which day you start the OCP, you will always start a new pack on that same day of the week. Have an extra pack of OCPs and a backup contraceptive method available in case you miss some pills or lose your OCP pack. Follow these instructions at home:  Do not smoke.  Always use a condom to protect against STDs. OCPs do not protect against STDs.  Use a calendar to mark your menstrual period days.  Read the information and directions that came with your OCP. Talk to your health care provider if you have questions. Contact a health care provider if:  You develop nausea and vomiting.  You have abnormal vaginal discharge or bleeding.  You develop a rash.  You miss your menstrual period.  You are losing your hair.  You need treatment for mood swings or depression.  You get dizzy when taking the OCP.  You develop acne from taking the OCP.  You become pregnant. Get help right away if:  You develop chest pain.  You develop shortness of breath.  You have an uncontrolled or  severe headache.  You develop numbness or slurred speech.  You develop visual problems.  You develop pain, redness, and swelling in the legs. This information is not intended to replace advice given to you by your health care provider. Make sure you discuss any questions you have with your health care provider. Document Released: 11/01/2011 Document Revised: 04/19/2016 Document Reviewed: 05/03/2013 Elsevier Interactive Patient Education  2017 [employer].

## 2017-09-09 NOTE — Progress Notes (Signed)
Subjective:        Regina Shaffer is a 21 y.o. female here for a routine exam.  Current complaints: Tightness in upper abdomen.  Denies abdominal pain.    Personal health questionnaire:  Is patient Ashkenazi Jewish, have a family history of breast and/or ovarian cancer: no Is there a family history of uterine cancer diagnosed at age < 60, gastrointestinal cancer, urinary tract cancer, family member who is a Personnel officer syndrome-associated carrier: no Is the patient overweight and hypertensive, family history of diabetes, personal history of gestational diabetes, preeclampsia or PCOS: no Is patient over 46, have PCOS,  family history of premature CHD under age 39, diabetes, smoke, have hypertension or peripheral artery disease:  no At any time, has a partner hit, kicked or otherwise hurt or frightened you?: no Over the past 2 weeks, have you felt down, depressed or hopeless?: no Over the past 2 weeks, have you felt little interest or pleasure in doing things?:no   Gynecologic History Patient's last menstrual period was 07/22/2017. Contraception: none Last Pap:2014 . Results were: normal Last mammogram: n/a. Results were: n/a  Obstetric History OB History  Gravida Para Term Preterm AB Living  0 0 0 0 0 0  SAB TAB Ectopic Multiple Live Births  0 0 0 0 0        Past Medical History:  Diagnosis Date  . Chlamydia   . Gastric ulcer   . GERD (gastroesophageal reflux disease)   . Hypotension     Past Surgical History:  Procedure Laterality Date  . MYRINGOTOMY      No current outpatient prescriptions on file. No Known Allergies  Social History  Substance Use Topics  . Smoking status: Current Every Day Smoker    Packs/day: 1.00    Types: Cigarettes  . Smokeless tobacco: Never Used  . Alcohol use No    Family History  Problem Relation Age of Onset  . Hypertension Maternal Grandmother   . Diabetes Maternal Grandmother       Review of Systems  Constitutional: negative  for fatigue and weight loss Respiratory: negative for cough and wheezing Cardiovascular: negative for chest pain, fatigue and palpitations Gastrointestinal: negative for abdominal pain and change in bowel habits Musculoskeletal:negative for myalgias Neurological: negative for gait problems and tremors Behavioral/Psych: negative for abusive relationship, depression Endocrine: negative for temperature intolerance    Genitourinary:negative for abnormal menstrual periods, genital lesions, hot flashes, sexual problems and vaginal discharge Integument/breast: negative for breast lump, breast tenderness, nipple discharge and skin lesion(s)    Objective:       BP 120/80   Pulse 94   Ht 5' (1.524 m)   Wt 280 lb 3.2 oz (127.1 kg)   LMP 07/22/2017   BMI 54.72 kg/m  General:   alert  Skin:   no rash or abnormalities  Lungs:   clear to auscultation bilaterally  Heart:   regular rate and rhythm, S1, S2 normal, no murmur, click, rub or gallop  Breasts:   normal without suspicious masses, skin or nipple changes or axillary nodes  Abdomen:  normal findings: no organomegaly, soft, non-tender and no hernia  Pelvis:  External genitalia: normal general appearance Urinary system: urethral meatus normal and bladder without fullness, nontender Vaginal: normal without tenderness, induration or masses Cervix: normal appearance Adnexa: normal bimanual exam Uterus: anteverted and non-tender, normal size   Lab Review Urine pregnancy test Labs reviewed yes Radiologic studies reviewed yes  50% of 20 min visit spent on counseling  and coordination of care.    Assessment:     1. Encounter for routine gynecological examination with Papanicolaou smear of cervix Rx - Cytology - PAP  2. Vaginal discharge Rx: - Cervicovaginal ancillary only  3. Screening for STD (sexually transmitted disease) Rx: - HIV antibody - RPR  4. Encounter for other general counseling and advice on contraception - wants  OCP's - patient warned of risks associated with tobacco dependence and OCP's - she understands that she must stop smoking to continue OCP's - Chantix offered as a method to aid in tobacco cessation.  She agreed to consider taking Chantix.  5. Encounter for initial prescription of contraceptive pills Rx: - LO LOESTRIN FE 1 MG-10 MCG / 10 MCG tablet; Take 1 tablet by mouth daily.  Dispense: 1 Package; Refill: 11  6. Tobacco dependence due to cigarettes - Chantix recommended to help stop smoking  7. Class 3 severe obesity due to excess calories without serious comorbidity with body mass index (BMI) of 50.0 to 59.9 in adult [facility]) Rx: -  Weight loss program that includes diet and exercise recommended   Plan:    Education reviewed: calcium supplements, depression evaluation, low fat, low cholesterol diet, safe sex/STD prevention, self breast exams, smoking cessation and weight bearing exercise. Contraception: OCP (estrogen/progesterone). Follow up in: 1 year.   No orders of the defined types were placed in this encounter.  Orders Placed This Encounter  Procedures  . HIV antibody  . RPR

## 2017-09-10 LAB — CERVICOVAGINAL ANCILLARY ONLY
CHLAMYDIA, DNA PROBE: NEGATIVE
Neisseria Gonorrhea: NEGATIVE

## 2017-09-10 LAB — CYTOLOGY - PAP: Diagnosis: NEGATIVE

## 2017-09-10 LAB — RPR: RPR: NONREACTIVE

## 2017-09-10 LAB — HIV ANTIBODY (ROUTINE TESTING W REFLEX): HIV Screen 4th Generation wRfx: NONREACTIVE

## 2017-11-22 ENCOUNTER — Ambulatory Visit (INDEPENDENT_AMBULATORY_CARE_PROVIDER_SITE_OTHER): Payer: Medicaid Other | Admitting: Obstetrics

## 2017-11-22 ENCOUNTER — Encounter: Payer: Self-pay | Admitting: Obstetrics

## 2017-11-22 VITALS — BP 119/82 | HR 94 | Ht 60.0 in | Wt 273.4 lb

## 2017-11-22 DIAGNOSIS — B369 Superficial mycosis, unspecified: Secondary | ICD-10-CM

## 2017-11-22 DIAGNOSIS — B373 Candidiasis of vulva and vagina: Secondary | ICD-10-CM

## 2017-11-22 DIAGNOSIS — Z6841 Body Mass Index (BMI) 40.0 and over, adult: Secondary | ICD-10-CM

## 2017-11-22 DIAGNOSIS — Z3201 Encounter for pregnancy test, result positive: Secondary | ICD-10-CM

## 2017-11-22 DIAGNOSIS — Z3200 Encounter for pregnancy test, result unknown: Secondary | ICD-10-CM

## 2017-11-22 DIAGNOSIS — B3731 Acute candidiasis of vulva and vagina: Secondary | ICD-10-CM

## 2017-11-22 DIAGNOSIS — Z3009 Encounter for other general counseling and advice on contraception: Secondary | ICD-10-CM

## 2017-11-22 LAB — POCT URINE PREGNANCY: PREG TEST UR: POSITIVE — AB

## 2017-11-22 MED ORDER — PHENTERMINE HCL 37.5 MG PO CAPS: 37.5000 mg | ORAL_CAPSULE | ORAL | 2 refills | Status: DC

## 2017-11-22 MED ORDER — CLOTRIMAZOLE 1 % EX CREA: 1.0000 "application " | TOPICAL_CREAM | Freq: Two times a day (BID) | CUTANEOUS | 2 refills | Status: DC

## 2017-11-22 MED ORDER — FLUCONAZOLE 150 MG PO TABS: 150.0000 mg | ORAL_TABLET | Freq: Once | ORAL | 2 refills | Status: DC

## 2017-11-22 NOTE — Addendum Note (Signed)
Addended by: Natale MilchSTALLING, Danean Marner D on: 11/22/2017 03:18 PM   Modules accepted: Orders

## 2017-11-22 NOTE — Progress Notes (Signed)
[facility] pt is currently on Lo lo estrogen but thinks it is causing frequent yeast infections, pt unsure of LMP.  UPT is positive.  Patient given results.  A/P:  Early. Pregnancy.  Follow up for NOB appointmentt at ~ [redacted] weeks gestation.  Charles A. Clearance CootsHarper MD

## 2017-11-26 NOTE — L&D Delivery Note (Signed)
Patient is 22 y.o. G1P0000 6020w1d admitted for IOL for postdates. S/p IOL with cytotec, followed by Pitocin. AROM at 0036.  Prenatal course also complicated by obesity, limited PNC.  GBS positive  Operative Delivery Note At 8:47 PM a viable female was delivered via Vaginal, Vacuum Investment banker, operational(Extractor).  Presentation: vertex; Position: Left,, Occiput,, Anterior; Station: +2.  Patient pushing, and despite good maternal effort, unable to deliver fetal head. Fetus was also having prolonged deep decels. Decision made for vacuum-assisted delivery. Verbal consent: obtained from patient. The soft vacuum cup was positioned over the sagittal suture 3 cm anterior to posterior fontanelle.  Pressure was then increased to 500 mmHg, and the patient was instructed to push. Pulling was administered along the pelvic curve. Delivery of fetal head occurred but unable to delivery the body.  No nuchal cord present. Shoulder dystocia called. Maneuvers below:  Delivery of the head: 05/23/2018  8:46 PM First maneuver: 05/23/2018  8:46 PM, [place]  Second maneuver: 05/23/2018  8:46 PM, Suprapubic Pressure Third maneuver: 05/23/2018  8:46 PM,   Fourth maneuver: 05/23/2018  8:46 PM, Posterior arm delivered  APGAR: 6, 9; weight pending 1hr skin to skin.   Placenta status: Intact.   Cord: 3V with the following complications: None.  Cord pH: 7.3  Anesthesia: Epidural Episiotomy: None Lacerations: Sulcus Suture Repair: 3.0 monocryl Est. Blood Loss (mL): 250  Mom to postpartum.  Baby to Couplet care / Skin to Skin.  Caryl AdaJazma Clella Mckeel, DO 05/23/2018, 9:12 PM

## 2017-12-01 ENCOUNTER — Inpatient Hospital Stay (HOSPITAL_COMMUNITY)
Admission: AD | Admit: 2017-12-01 | Discharge: 2017-12-01 | Disposition: A | Discharge status: Home / Self Care | Payer: Medicaid Other | Source: Ambulatory Visit | Attending: Obstetrics & Gynecology | Admitting: Obstetrics & Gynecology

## 2017-12-01 ENCOUNTER — Inpatient Hospital Stay (HOSPITAL_COMMUNITY): Payer: Medicaid Other

## 2017-12-01 ENCOUNTER — Encounter (HOSPITAL_COMMUNITY): Payer: Self-pay | Admitting: Emergency Medicine

## 2017-12-01 DIAGNOSIS — B9689 Other specified bacterial agents as the cause of diseases classified elsewhere: Secondary | ICD-10-CM

## 2017-12-01 DIAGNOSIS — O23592 Infection of other part of genital tract in pregnancy, second trimester: Secondary | ICD-10-CM | POA: Insufficient documentation

## 2017-12-01 DIAGNOSIS — K5900 Constipation, unspecified: Secondary | ICD-10-CM

## 2017-12-01 DIAGNOSIS — R109 Unspecified abdominal pain: Secondary | ICD-10-CM

## 2017-12-01 DIAGNOSIS — N760 Acute vaginitis: Secondary | ICD-10-CM

## 2017-12-01 DIAGNOSIS — Z3A16 16 weeks gestation of pregnancy: Secondary | ICD-10-CM | POA: Insufficient documentation

## 2017-12-01 DIAGNOSIS — O26892 Other specified pregnancy related conditions, second trimester: Secondary | ICD-10-CM | POA: Diagnosis not present

## 2017-12-01 DIAGNOSIS — O99332 Smoking (tobacco) complicating pregnancy, second trimester: Secondary | ICD-10-CM | POA: Diagnosis not present

## 2017-12-01 DIAGNOSIS — O4692 Antepartum hemorrhage, unspecified, second trimester: Secondary | ICD-10-CM | POA: Diagnosis not present

## 2017-12-01 DIAGNOSIS — O26891 Other specified pregnancy related conditions, first trimester: Secondary | ICD-10-CM

## 2017-12-01 DIAGNOSIS — O209 Hemorrhage in early pregnancy, unspecified: Secondary | ICD-10-CM

## 2017-12-01 DIAGNOSIS — Z6790 Unspecified blood type, Rh positive: Secondary | ICD-10-CM

## 2017-12-01 LAB — URINALYSIS, ROUTINE W REFLEX MICROSCOPIC
BILIRUBIN URINE: NEGATIVE
Glucose, UA: NEGATIVE mg/dL
HGB URINE DIPSTICK: NEGATIVE
Ketones, ur: NEGATIVE mg/dL
NITRITE: NEGATIVE
Protein, ur: NEGATIVE mg/dL
SPECIFIC GRAVITY, URINE: 1.015 (ref 1.005–1.030)
pH: 6 (ref 5.0–8.0)

## 2017-12-01 LAB — CBC
HCT: 34.9 % — ABNORMAL LOW (ref 36.0–46.0)
Hemoglobin: 11.6 g/dL — ABNORMAL LOW (ref 12.0–15.0)
MCH: 26.4 pg (ref 26.0–34.0)
MCHC: 33.2 g/dL (ref 30.0–36.0)
MCV: 79.3 fL (ref 78.0–100.0)
PLATELETS: 266 10*3/uL (ref 150–400)
RBC: 4.4 MIL/uL (ref 3.87–5.11)
RDW: 14.1 % (ref 11.5–15.5)
WBC: 8.7 10*3/uL (ref 4.0–10.5)

## 2017-12-01 LAB — WET PREP, GENITAL
Sperm: NONE SEEN
TRICH WET PREP: NONE SEEN
Yeast Wet Prep HPF POC: NONE SEEN

## 2017-12-01 LAB — HCG, QUANTITATIVE, PREGNANCY: HCG, BETA CHAIN, QUANT, S: 9946 m[IU]/mL — AB (ref <5)

## 2017-12-01 MED ORDER — POLYETHYLENE GLYCOL 3350 17 GM/SCOOP PO POWD: 17.0000 g | Freq: Every day | ORAL | 0 refills | Status: DC

## 2017-12-01 MED ORDER — METRONIDAZOLE 500 MG PO TABS: 500.0000 mg | ORAL_TABLET | Freq: Two times a day (BID) | ORAL | 0 refills | Status: DC

## 2017-12-01 NOTE — Discharge Instructions (Signed)
Bacterial Vaginosis °Bacterial vaginosis is an infection of the vagina. It happens when too many germs (bacteria) grow in the vagina. This infection puts you at risk for infections from sex (STIs). Treating this infection can lower your risk for some STIs. You should also treat this if you are pregnant. It can cause your baby to be born early. °Follow these instructions at home: °Medicines °· Take over-the-counter and prescription medicines only as told by your doctor. °· Take or use your antibiotic medicine as told by your doctor. Do not stop taking or using it even if you start to feel better. °General instructions °· If you your sexual partner is a woman, tell her that you have this infection. She needs to get treatment if she has symptoms. If you have a female partner, he does not need to be treated. °· During treatment: °? Avoid sex. °? Do not douche. °? Avoid alcohol as told. °? Avoid breastfeeding as told. °· Drink enough fluid to keep your pee (urine) clear or pale yellow. °· Keep your vagina and butt (rectum) clean. °? Wash the area with warm water every day. °? Wipe from front to back after you use the toilet. °· Keep all follow-up visits as told by your doctor. This is important. °Preventing this condition °· Do not douche. °· Use only warm water to wash around your vagina. °· Use protection when you have sex. This includes: °? Latex condoms. °? Dental dams. °· Limit how many people you have sex with. It is best to only have sex with the same person (be monogamous). °· Get tested for STIs. Have your partner get tested. °· Wear underwear that is cotton or lined with cotton. °· Avoid tight pants and pantyhose. This is most important in summer. °· Do not use any products that have nicotine or tobacco in them. These include cigarettes and e-cigarettes. If you need help quitting, ask your doctor. °· Do not use illegal drugs. °· Limit how much alcohol you drink. °Contact a doctor if: °· Your symptoms do not get  better, even after you are treated. °· You have more discharge or pain when you pee (urinate). °· You have a fever. °· You have pain in your belly (abdomen). °· You have pain with sex. °· Your bleed from your vagina between periods. °Summary °· This infection happens when too many germs (bacteria) grow in the vagina. °· Treating this condition can lower your risk for some infections from sex (STIs). °· You should also treat this if you are pregnant. It can cause early (premature) birth. °· Do not stop taking or using your antibiotic medicine even if you start to feel better. °This information is not intended to replace advice given to you by your health care provider. Make sure you discuss any questions you have with your health care provider. °Document Released: 08/21/2008 Document Revised: 07/28/2016 Document Reviewed: 07/28/2016 °Elsevier Interactive Patient Education © 2017 [employer] ° °Constipation, Adult °Constipation is when a person: °· Poops (has a bowel movement) fewer times in a week than normal. °· Has a hard time pooping. °· Has poop that is dry, hard, or bigger than normal. ° °Follow these instructions at home: °Eating and drinking ° °· Eat foods that have a lot of fiber, such as: °? Fresh fruits and vegetables. °? Whole grains. °? Beans. °· Eat less of foods that are high in fat, low in fiber, or overly processed, such as: °? French fries. °? Hamburgers. °? Cookies. °?   Candy. °? Soda. °· Drink enough fluid to keep your pee (urine) clear or pale yellow. °General instructions °· Exercise regularly or as told by your doctor. °· Go to the restroom when you feel like you need to poop. Do not hold it in. °· Take over-the-counter and prescription medicines only as told by your doctor. These include any fiber supplements. °· Do pelvic floor retraining exercises, such as: °? Doing deep breathing while relaxing your lower belly (abdomen). °? Relaxing your pelvic floor while pooping. °· Watch your condition  for any changes. °· Keep all follow-up visits as told by your doctor. This is important. °Contact a doctor if: °· You have pain that gets worse. °· You have a fever. °· You have not pooped for 4 days. °· You throw up (vomit). °· You are not hungry. °· You lose weight. °· You are bleeding from the anus. °· You have thin, pencil-like poop (stool). °Get help right away if: °· You have a fever, and your symptoms suddenly get worse. °· You leak poop or have blood in your poop. °· Your belly feels hard or bigger than normal (is bloated). °· You have very bad belly pain. °· You feel dizzy or you faint. °This information is not intended to replace advice given to you by your health care provider. Make sure you discuss any questions you have with your health care provider. °Document Released: 04/30/2008 Document Revised: 06/01/2016 Document Reviewed: 05/02/2016 °Elsevier Interactive Patient Education © 2018 Else[employer] Inc. ° °

## 2017-12-01 NOTE — MAU Provider Note (Signed)
History     CSN: [id number]  Arrival date and time: 12/01/17 2110   First Provider Initiated Contact with Patient 12/01/17 2200      Chief Complaint  Patient presents with  . Abdominal Pain  . Vaginal Bleeding   G1 @unknown  gestation here with LAP and spotting. Spotting happened this morning, was red on the toilet paper and didn't happen again. Pain is in lower abdomen, feels sharp and cramping, worse on LLQ. Reports some constipation, had BM this am but was small. No fevers. No urinary sx. New partner about 7 mos ago.    OB History    Gravida Para Term Preterm AB Living   1 0 0 0 0 0   SAB TAB Ectopic Multiple Live Births   0 0 0 0 0      Past Medical History:  Diagnosis Date  . Chlamydia   . Gastric ulcer   . GERD (gastroesophageal reflux disease)   . Hypotension     Past Surgical History:  Procedure Laterality Date  . MYRINGOTOMY      Family History  Problem Relation Age of Onset  . Hypertension Maternal Grandmother   . Diabetes Maternal Grandmother     Social History   Tobacco Use  . Smoking status: Current Every Day Smoker    Packs/day: 1.00    Types: Cigarettes  . Smokeless tobacco: Never Used  Substance Use Topics  . Alcohol use: No  . Drug use: No    Allergies: No Known Allergies  No medications prior to admission.    Review of Systems  Constitutional: Negative.   Gastrointestinal: Positive for abdominal pain.  Genitourinary: Positive for vaginal bleeding. Negative for dysuria, frequency and urgency.   Physical Exam   Blood pressure 128/68, pulse 93, temperature 98.5 F (36.9 C), temperature source Oral, resp. rate 16, height 5' (1.524 m), weight 281 lb 8 oz (127.7 kg), last menstrual period 07/22/2017, SpO2 100 %.  Physical Exam  Nursing note and vitals reviewed. Constitutional: She is oriented to person, place, and time. She appears well-developed and well-nourished.  HENT:  Head: Normocephalic and atraumatic.  Neck: Normal range  of motion.  Cardiovascular: Normal rate.  Respiratory: Effort normal. No respiratory distress.  GI: Soft. She exhibits no distension and no mass. There is tenderness in the suprapubic area and left lower quadrant. There is no rebound and no guarding.  Genitourinary:  Genitourinary Comments: External: no lesions or erythema Vagina: rugated, pink, moist, thin white frothy discharge Uterus: non enlarged, anteverted, non tender, no CMT Adnexae: no masses, no tenderness left, no tenderness right   Musculoskeletal: Normal range of motion.  Neurological: She is alert and oriented to person, place, and time.  Skin: Skin is warm and dry.  Psychiatric: She has a normal mood and affect.   Results for orders placed or performed during the hospital encounter of 12/01/17 (from the past 24 hour(s))  Urinalysis, Routine w reflex microscopic     Status: Abnormal   Collection Time: 12/01/17  9:31 PM  Result Value Ref Range   Color, Urine YELLOW YELLOW   APPearance CLEAR CLEAR   Specific Gravity, Urine 1.015 1.005 - 1.030   pH 6.0 5.0 - 8.0   Glucose, UA NEGATIVE NEGATIVE mg/dL   Hgb urine dipstick NEGATIVE NEGATIVE   Bilirubin Urine NEGATIVE NEGATIVE   Ketones, ur NEGATIVE NEGATIVE mg/dL   Protein, ur NEGATIVE NEGATIVE mg/dL   Nitrite NEGATIVE NEGATIVE   Leukocytes, UA TRACE (A) NEGATIVE  CBC  Status: Abnormal   Collection Time: 12/01/17  9:46 PM  Result Value Ref Range   WBC 8.7 4.0 - 10.5 K/uL   RBC 4.40 3.87 - 5.11 MIL/uL   Hemoglobin 11.6 (L) 12.0 - 15.0 g/dL   HCT 16.1 (L) 09.6 - 04.5 %   MCV 79.3 78.0 - 100.0 fL   MCH 26.4 26.0 - 34.0 pg   MCHC 33.2 30.0 - 36.0 g/dL   RDW 40.9 81.1 - 91.4 %   Platelets 266 150 - 400 K/uL  hCG, quantitative, pregnancy     Status: Abnormal   Collection Time: 12/01/17  9:46 PM  Result Value Ref Range   hCG, Beta Chain, Quant, S 9,946 (H) <5 mIU/mL  Wet prep, genital     Status: Abnormal   Collection Time: 12/01/17 10:08 PM  Result Value Ref Range    Yeast Wet Prep HPF POC NONE SEEN NONE SEEN   Trich, Wet Prep NONE SEEN NONE SEEN   Clue Cells Wet Prep HPF POC PRESENT (A) NONE SEEN   WBC, Wet Prep HPF POC MODERATE (A) NONE SEEN   Sperm NONE SEEN    MAU Course  Procedures  MDM Labs and [place] ordered and reviewed. Normal IUP @[redacted]w[redacted]d  on [place].  Assessment and Plan  [redacted] weeks gestation Bacterial vaginosis Spotting in pregnancy Rh positive Constipation  Discharge home Follow up with OB provider of choice soon to start care Rx Flagyl Rx Miralax Start PNV 1 po daily Return precautions  Allergies as of 12/01/2017   No Known Allergies     Medication List    TAKE these medications   metroNIDAZOLE 500 MG tablet Commonly known as:  FLAGYL Take 1 tablet (500 mg total) by mouth 2 (two) times daily.   polyethylene glycol powder powder Commonly known as:  GLYCOLAX/MIRALAX Take 17 g by mouth daily.      Donette Larry, CNM 12/01/2017, 11:52 PM

## 2017-12-01 NOTE — MAU Note (Signed)
Pt c/o abd pain on the left lower side that feels like a constant cramp. She also c/o lower abd cramping the is with movement that feels like pulling. C/o vag. Bleeding 1x earlier today. Denies any sexual intercourse or vag. Discharge. Denies burning w/ urination.

## 2017-12-02 LAB — GC/CHLAMYDIA PROBE AMP (~~LOC~~) NOT AT ARMC
Chlamydia: NEGATIVE
NEISSERIA GONORRHEA: NEGATIVE

## 2017-12-04 ENCOUNTER — Other Ambulatory Visit: Payer: Self-pay

## 2017-12-04 ENCOUNTER — Emergency Department (HOSPITAL_COMMUNITY)
Admission: EM | Admit: 2017-12-04 | Discharge: 2017-12-04 | Disposition: A | Discharge status: Home / Self Care | Payer: No Typology Code available for payment source | Attending: Physician Assistant | Admitting: Physician Assistant

## 2017-12-04 ENCOUNTER — Encounter (HOSPITAL_COMMUNITY): Payer: Self-pay | Admitting: *Deleted

## 2017-12-04 DIAGNOSIS — M542 Cervicalgia: Secondary | ICD-10-CM | POA: Diagnosis present

## 2017-12-04 DIAGNOSIS — Y9389 Activity, other specified: Secondary | ICD-10-CM | POA: Insufficient documentation

## 2017-12-04 DIAGNOSIS — M549 Dorsalgia, unspecified: Secondary | ICD-10-CM | POA: Diagnosis not present

## 2017-12-04 DIAGNOSIS — F17210 Nicotine dependence, cigarettes, uncomplicated: Secondary | ICD-10-CM | POA: Insufficient documentation

## 2017-12-04 DIAGNOSIS — Y92410 Unspecified street and highway as the place of occurrence of the external cause: Secondary | ICD-10-CM | POA: Diagnosis not present

## 2017-12-04 DIAGNOSIS — Y999 Unspecified external cause status: Secondary | ICD-10-CM | POA: Diagnosis not present

## 2017-12-04 MED ORDER — ACETAMINOPHEN 500 MG PO TABS: 500.0000 mg | ORAL_TABLET | Freq: Four times a day (QID) | ORAL | 0 refills | Status: DC | PRN

## 2017-12-04 MED ORDER — ACETAMINOPHEN 325 MG PO TABS
650.0000 mg | ORAL_TABLET | Freq: Once | ORAL | Status: AC
  Administered 2017-12-04: 650 mg via ORAL
  Filled 2017-12-04: qty 2
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
## 2017-12-04 NOTE — Discharge Instructions (Signed)
As discussed, take Tylenol as needed every 6 hours, ply warm compresses to your neck and lower back as needed for muscle spasm.  Follow up with your OB/GYN and PCP at your upcoming appointment.  [facility] department if you experience abdominal pain, vaginal discharge, bleeding or other new concerning symptoms in the meantime.

## 2017-12-10 ENCOUNTER — Other Ambulatory Visit: Payer: Self-pay | Admitting: Obstetrics and Gynecology

## 2017-12-10 ENCOUNTER — Other Ambulatory Visit (HOSPITAL_COMMUNITY)
Admission: RE | Admit: 2017-12-10 | Discharge: 2017-12-10 | Disposition: A | Discharge status: Home / Self Care | Payer: Medicaid Other | Source: Ambulatory Visit | Attending: Obstetrics and Gynecology | Admitting: Obstetrics and Gynecology

## 2017-12-10 ENCOUNTER — Encounter: Payer: Self-pay | Admitting: Obstetrics and Gynecology

## 2017-12-10 ENCOUNTER — Ambulatory Visit (INDEPENDENT_AMBULATORY_CARE_PROVIDER_SITE_OTHER): Payer: Medicaid Other | Admitting: Obstetrics and Gynecology

## 2017-12-10 VITALS — BP 119/74 | HR 108 | Wt 280.4 lb

## 2017-12-10 DIAGNOSIS — O99212 Obesity complicating pregnancy, second trimester: Secondary | ICD-10-CM | POA: Diagnosis not present

## 2017-12-10 DIAGNOSIS — Z3A17 17 weeks gestation of pregnancy: Secondary | ICD-10-CM | POA: Diagnosis not present

## 2017-12-10 DIAGNOSIS — O99332 Smoking (tobacco) complicating pregnancy, second trimester: Secondary | ICD-10-CM | POA: Diagnosis not present

## 2017-12-10 DIAGNOSIS — F17210 Nicotine dependence, cigarettes, uncomplicated: Secondary | ICD-10-CM | POA: Diagnosis not present

## 2017-12-10 DIAGNOSIS — O0992 Supervision of high risk pregnancy, unspecified, second trimester: Secondary | ICD-10-CM | POA: Diagnosis present

## 2017-12-10 DIAGNOSIS — O0991 Supervision of high risk pregnancy, unspecified, first trimester: Secondary | ICD-10-CM

## 2017-12-10 DIAGNOSIS — Z6841 Body Mass Index (BMI) 40.0 and over, adult: Secondary | ICD-10-CM

## 2017-12-10 DIAGNOSIS — O99210 Obesity complicating pregnancy, unspecified trimester: Secondary | ICD-10-CM

## 2017-12-10 DIAGNOSIS — O99211 Obesity complicating pregnancy, first trimester: Secondary | ICD-10-CM

## 2017-12-10 DIAGNOSIS — O0930 Supervision of pregnancy with insufficient antenatal care, unspecified trimester: Secondary | ICD-10-CM

## 2017-12-10 DIAGNOSIS — E669 Obesity, unspecified: Secondary | ICD-10-CM | POA: Insufficient documentation

## 2017-12-10 DIAGNOSIS — O0990 Supervision of high risk pregnancy, unspecified, unspecified trimester: Secondary | ICD-10-CM

## 2017-12-10 DIAGNOSIS — O0931 Supervision of pregnancy with insufficient antenatal care, first trimester: Secondary | ICD-10-CM

## 2017-12-10 NOTE — Progress Notes (Signed)
1 HOUR GTT TODAY  URINE CULTURE

## 2017-12-11 DIAGNOSIS — O0930 Supervision of pregnancy with insufficient antenatal care, unspecified trimester: Secondary | ICD-10-CM | POA: Insufficient documentation

## 2017-12-11 LAB — COMPREHENSIVE METABOLIC PANEL
A/G RATIO: 1.2 (ref 1.2–2.2)
ALT: 12 IU/L (ref 0–32)
AST: 16 IU/L (ref 0–40)
Albumin: 3.4 g/dL — ABNORMAL LOW (ref 3.5–5.5)
Alkaline Phosphatase: 51 IU/L (ref 39–117)
BUN/Creatinine Ratio: 9 (ref 9–23)
BUN: 5 mg/dL — ABNORMAL LOW (ref 6–20)
Bilirubin Total: <0.2 mg/dL (ref 0.0–1.2)
CALCIUM: 8.8 mg/dL (ref 8.7–10.2)
CO2: 20 mmol/L (ref 20–29)
CREATININE: 0.54 mg/dL — AB (ref 0.57–1.00)
Chloride: 104 mmol/L (ref 96–106)
GFR calc Af Amer: 154 mL/min/{1.73_m2} (ref >59)
GFR, EST NON AFRICAN AMERICAN: 133 mL/min/{1.73_m2} (ref >59)
Globulin, Total: 2.8 g/dL (ref 1.5–4.5)
Glucose: 110 mg/dL — ABNORMAL HIGH (ref 65–99)
POTASSIUM: 4.3 mmol/L (ref 3.5–5.2)
Sodium: 137 mmol/L (ref 134–144)
Total Protein: 6.2 g/dL (ref 6.0–8.5)

## 2017-12-11 LAB — OBSTETRIC PANEL, INCLUDING HIV
Antibody Screen: NEGATIVE
BASOS ABS: 0 10*3/uL (ref 0.0–0.2)
Basos: 0 %
EOS (ABSOLUTE): 0.2 10*3/uL (ref 0.0–0.4)
EOS: 2 %
HEMOGLOBIN: 11 g/dL — AB (ref 11.1–15.9)
HIV Screen 4th Generation wRfx: NONREACTIVE
Hematocrit: 33.6 % — ABNORMAL LOW (ref 34.0–46.6)
Hepatitis B Surface Ag: NEGATIVE
IMMATURE GRANS (ABS): 0 10*3/uL (ref 0.0–0.1)
Immature Granulocytes: 1 %
LYMPHS: 21 %
Lymphocytes Absolute: 1.7 10*3/uL (ref 0.7–3.1)
MCH: 26.1 pg — ABNORMAL LOW (ref 26.6–33.0)
MCHC: 32.7 g/dL (ref 31.5–35.7)
MCV: 80 fL (ref 79–97)
MONOS ABS: 0.6 10*3/uL (ref 0.1–0.9)
Monocytes: 7 %
Neutrophils Absolute: 5.7 10*3/uL (ref 1.4–7.0)
Neutrophils: 69 %
Platelets: 260 10*3/uL (ref 150–379)
RBC: 4.22 x10E6/uL (ref 3.77–5.28)
RDW: 14.9 % (ref 12.3–15.4)
RPR Ser Ql: NONREACTIVE
Rh Factor: POSITIVE
Rubella Antibodies, IGG: 1.19 index (ref >0.99)
WBC: 8.3 10*3/uL (ref 3.4–10.8)

## 2017-12-11 LAB — PROTEIN / CREATININE RATIO, URINE
Creatinine, Urine: 107.6 mg/dL
PROTEIN UR: 6.6 mg/dL
PROTEIN/CREAT RATIO: 61 mg/g{creat} (ref 0–200)

## 2017-12-11 LAB — CERVICOVAGINAL ANCILLARY ONLY
Chlamydia: NEGATIVE
NEISSERIA GONORRHEA: NEGATIVE
TRICH (WINDOWPATH): NEGATIVE

## 2017-12-11 LAB — URINE CULTURE: ORGANISM ID, BACTERIA: NO GROWTH

## 2017-12-11 LAB — GLUCOSE TOLERANCE, 1 HOUR: Glucose, 1Hr PP: 116 mg/dL (ref 65–199)

## 2017-12-11 LAB — TSH: TSH: 1.63 u[IU]/mL (ref 0.450–4.500)

## 2017-12-11 NOTE — Progress Notes (Signed)
New OB Note  12/10/2017   Clinic: [facility]  Chief Complaint: NOB  Transfer of Care Patient: no  History of Present Illness: Regina Shaffer is a 22 y.o. G1P0000 @ 17/5 weeks (EDC 6/20, based on 16wk u/s) Patient's last menstrual period was 07/22/2017 (lmp unknown).  Preg complicated by has Adjustment disorder with mixed disturbance of emotions and conduct; BMI 50.0-59.9, adult (HCC); Obesity in pregnancy; and Supervision of high risk pregnancy, antepartum on their problem list.   Any events prior to today's visit: TAUS noted on 1/6 MAU u/s for abdominal pain was 2.7cm Her periods were: irregular She was using no method when she conceived.  She has Negative signs or symptoms of nausea/vomiting of pregnancy. She has Negative signs or symptoms of miscarriage or preterm labor On any medications around the time she conceived/early pregnancy: No   ROS: A 12-point review of systems was performed and negative, except as stated in the above HPI.  OBGYN History: As per HPI. OB History  Gravida Para Term Preterm AB Living  1 0 0 0 0 0  SAB TAB Ectopic Multiple Live Births  0 0 0 0 0    # Outcome Date GA Lbr Len/2nd Weight Sex Delivery Anes PTL Lv  1 Current               Any issues with any prior pregnancies: not applicable Prior children are healthy, doing well, and without any problems or issues: not applicable History of pap smears: Yes. Last pap smear 2018 and results were NILM  Past Medical History: Past Medical History:  Diagnosis Date  . Chlamydia   . Gastric ulcer   . GERD (gastroesophageal reflux disease)   . Hypotension     Past Surgical History: Past Surgical History:  Procedure Laterality Date  . MYRINGOTOMY      Family History:  Family History  Problem Relation Age of Onset  . Hypertension Maternal Grandmother   . Diabetes Maternal Grandmother    She denies any female cancers, bleeding or blood clotting disorders.  She denies any  history of mental retardation, birth defects or genetic disorders in her or the FOB's history  Social History:  Social History   Socioeconomic History  . Marital status: Single    Spouse name: Not on file  . Number of children: Not on file  . Years of education: Not on file  . Highest education level: Not on file  Social Needs  . Financial resource strain: Not on file  . Food insecurity - worry: Not on file  . Food insecurity - inability: Not on file  . Transportation needs - medical: Not on file  . Transportation needs - non-medical: Not on file  Occupational History  . Not on file  Tobacco Use  . Smoking status: Current Every Day Smoker    Packs/day: 1.00    Types: Cigarettes  . Smokeless tobacco: Never Used  Substance and Sexual Activity  . Alcohol use: No  . Drug use: No  . Sexual activity: Not Currently    Partners: Male    Birth control/protection: None  Other Topics Concern  . Not on file  Social History Narrative  . Not on file  patient quit smoking when she found out she was pregnant  Allergy: No Known Allergies  Health Maintenance:  Mammogram Up to Date: not applicable  Current Outpatient Medications: PNV   Physical Exam:   BP 119/74   Pulse (!) 108   Wt  280 lb 6.4 oz (127.2 kg)   LMP 07/22/2017 (LMP Unknown)   BMI 54.76 kg/m  Body mass index is 54.76 kg/m.   Vag. Bleeding: None. Fundal height: not applicable FHTs: 160s  General appearance: Well nourished, well developed female in no acute distress.  Neck:  Supple, normal appearance, and no thyromegaly  Cardiovascular: S1, S2 normal, no murmur, rub or gallop, regular rate and rhythm Respiratory:  Clear to auscultation bilateral. Normal respiratory effort Abdomen: positive bowel sounds and no masses, hernias; diffusely non tender to palpation, non distended Breasts: breasts appear normal, no suspicious masses, no skin or nipple changes or axillary nodes, and normal palpation. Neuro/Psych:   Normal mood and affect.  Skin:  Warm and dry.  Lymphatic:  No inguinal lymphadenopathy.   Pelvic exam: is not limited by body habitus EGBUS: within normal limits, Vagina: within normal limits and with no blood in the vault, Cervix: normal appearing cervix without discharge or lesions, closed/long/high, Uterus:  enlarged, c/w 16-18 week size, and Adnexa:  normal adnexa and no mass, fullness, tenderness  Laboratory: none  Imaging:  none  Assessment: pt stable  Plan: 1. Supervision of high risk pregnancy, antepartum Routine care. Pt amenable to genetic screening. Anatomy u/s ordered. Will get asap TVUS CL given prior TAUS CL. Normal exam today - Cervicovaginal ancillary only - Protein / creatinine ratio, urine - Comprehensive metabolic panel - TSH - US MFM OB DETAIL +14 WK; Future - US MFM OB Transvaginal; Future - Urine Culture - Glucose Tolerance, 1 Hour - AFP TETRA - Obstetric Panel, Including HIV - SMN1 Copy Number Analysis - Cystic fibrosis gene test - Hemoglobinopathy Evaluation  2. Obesity in pregnancy Early gdm screening, tsh and baseline pre-x labs today. D/w pt re: keeping an eye on her weight.  - Protein / creatinine ratio, urine - Comprehensive metabolic panel - TSH  3. BMI 50.0-59.9, adult (HCC) - Protein / creatinine ratio, urine - Comprehensive metabolic panel - TSH  Problem list reviewed and updated.  Follow up in 3 weeks.   >50% of 25 min visit spent on counseling and coordination of care.     Cornelia Copaharlie Aseem Sessums, Jr. MD Attending [facility](Faculty Practice)

## 2017-12-13 ENCOUNTER — Encounter: Payer: Self-pay | Admitting: *Deleted

## 2017-12-13 ENCOUNTER — Encounter (HOSPITAL_COMMUNITY): Payer: Self-pay

## 2017-12-13 ENCOUNTER — Ambulatory Visit (HOSPITAL_COMMUNITY)
Admission: RE | Admit: 2017-12-13 | Discharge: 2017-12-13 | Disposition: A | Discharge status: Home / Self Care | Payer: Medicaid Other | Source: Ambulatory Visit | Attending: Obstetrics and Gynecology | Admitting: Obstetrics and Gynecology

## 2017-12-13 DIAGNOSIS — O0992 Supervision of high risk pregnancy, unspecified, second trimester: Secondary | ICD-10-CM | POA: Insufficient documentation

## 2017-12-13 DIAGNOSIS — Z3A18 18 weeks gestation of pregnancy: Secondary | ICD-10-CM | POA: Diagnosis not present

## 2017-12-13 DIAGNOSIS — O0990 Supervision of high risk pregnancy, unspecified, unspecified trimester: Secondary | ICD-10-CM

## 2017-12-13 DIAGNOSIS — Z3686 Encounter for antenatal screening for cervical length: Secondary | ICD-10-CM | POA: Diagnosis not present

## 2017-12-17 LAB — HEMOGLOBINOPATHY EVALUATION
Ferritin: 21 ng/mL (ref 15–150)
HEMATOCRIT: 33.8 % — AB (ref 34.0–46.6)
HEMOGLOBIN: 10.9 g/dL — AB (ref 11.1–15.9)
HGB A2 QUANT: 2.3 % (ref 1.8–3.2)
HGB A: 97.7 % (ref 96.4–98.8)
HGB C: 0 %
HGB S: 0 %
HGB SOLUBILITY: NEGATIVE
HGB VARIANT: 0 %
Hgb F Quant: 0 % (ref 0.0–2.0)
MCH: 26.1 pg — ABNORMAL LOW (ref 26.6–33.0)
MCHC: 32.2 g/dL (ref 31.5–35.7)
MCV: 81 fL (ref 79–97)
Platelets: 265 10*3/uL (ref 150–379)
RBC: 4.17 x10E6/uL (ref 3.77–5.28)
RDW: 14.9 % (ref 12.3–15.4)
WBC: 8.6 10*3/uL (ref 3.4–10.8)

## 2017-12-17 LAB — AFP TETRA
DIA Mom Value: 0.92
DIA VALUE (EIA): 109.71 pg/mL
DSR (BY AGE) 1 IN: 1067
DSR (SECOND TRIMESTER) 1 IN: 10000
Gestational Age: 17.5 WEEKS
MSAFP MOM: 0.71
MSAFP: 21.3 ng/mL
MSHCG Mom: 0.55
MSHCG: 10782 m[IU]/mL
Maternal Age At EDD: 24.1 yr
Osb Risk: 10000
TEST RESULTS AFP: NEGATIVE
WEIGHT: 280 [lb_av]
uE3 Mom: 0.67
uE3 Value: 0.67 ng/mL

## 2017-12-17 LAB — SMN1 COPY NUMBER ANALYSIS (SMA CARRIER SCREENING)

## 2017-12-17 LAB — CYSTIC FIBROSIS GENE TEST

## 2017-12-24 ENCOUNTER — Ambulatory Visit (HOSPITAL_COMMUNITY)
Admission: RE | Admit: 2017-12-24 | Discharge: 2017-12-24 | Disposition: A | Discharge status: Home / Self Care | Payer: No Typology Code available for payment source | Source: Ambulatory Visit | Attending: Obstetrics and Gynecology | Admitting: Obstetrics and Gynecology

## 2017-12-24 DIAGNOSIS — O0990 Supervision of high risk pregnancy, unspecified, unspecified trimester: Secondary | ICD-10-CM

## 2017-12-27 ENCOUNTER — Ambulatory Visit (HOSPITAL_COMMUNITY)
Admission: RE | Admit: 2017-12-27 | Discharge: 2017-12-27 | Disposition: A | Discharge status: Home / Self Care | Payer: Medicaid Other | Source: Ambulatory Visit | Attending: Obstetrics and Gynecology | Admitting: Obstetrics and Gynecology

## 2017-12-27 ENCOUNTER — Encounter (HOSPITAL_COMMUNITY): Payer: Self-pay

## 2017-12-27 DIAGNOSIS — Z6841 Body Mass Index (BMI) 40.0 and over, adult: Secondary | ICD-10-CM | POA: Insufficient documentation

## 2017-12-27 DIAGNOSIS — Z363 Encounter for antenatal screening for malformations: Secondary | ICD-10-CM | POA: Diagnosis not present

## 2017-12-27 DIAGNOSIS — Z3A20 20 weeks gestation of pregnancy: Secondary | ICD-10-CM | POA: Diagnosis not present

## 2017-12-27 DIAGNOSIS — O0992 Supervision of high risk pregnancy, unspecified, second trimester: Secondary | ICD-10-CM | POA: Diagnosis not present

## 2017-12-27 DIAGNOSIS — O99212 Obesity complicating pregnancy, second trimester: Secondary | ICD-10-CM | POA: Diagnosis not present

## 2017-12-27 DIAGNOSIS — O0990 Supervision of high risk pregnancy, unspecified, unspecified trimester: Secondary | ICD-10-CM | POA: Diagnosis present

## 2017-12-30 ENCOUNTER — Other Ambulatory Visit (HOSPITAL_COMMUNITY): Payer: Self-pay | Admitting: *Deleted

## 2017-12-30 DIAGNOSIS — Z362 Encounter for other antenatal screening follow-up: Secondary | ICD-10-CM

## 2017-12-31 ENCOUNTER — Encounter: Payer: Self-pay | Admitting: Obstetrics and Gynecology

## 2017-12-31 ENCOUNTER — Ambulatory Visit (INDEPENDENT_AMBULATORY_CARE_PROVIDER_SITE_OTHER): Payer: Medicaid Other | Admitting: Obstetrics and Gynecology

## 2017-12-31 DIAGNOSIS — O0990 Supervision of high risk pregnancy, unspecified, unspecified trimester: Secondary | ICD-10-CM

## 2017-12-31 NOTE — Progress Notes (Signed)
Prenatal Visit Note Date: 12/31/2017 Clinic: [facility]  Subjective:  Regina Shaffer is a 22 y.o. G1P0000 at 8628w5d being seen today for ongoing prenatal care.  She is currently monitored for the following issues for this high-risk pregnancy and has Adjustment disorder with mixed disturbance of emotions and conduct; BMI 50.0-59.9, adult (HCC); Obesity in pregnancy; Supervision of high risk pregnancy, antepartum; and Late prenatal care on their problem list.  Patient reports no complaints.   Contractions: Not present. Vag. Bleeding: None.  Movement: Present. Denies leaking of fluid.   The following portions of the patient's history were reviewed and updated as appropriate: allergies, current medications, past family history, past medical history, past social history, past surgical history and problem list. Problem list updated.  Objective:   Vitals:   12/31/17 0927  BP: 125/74  Pulse: (!) 107  Weight: 284 lb (128.8 kg)    Fetal Status: Fetal Heart Rate (bpm): 144   Movement: Present     General:  Alert, oriented and cooperative. Patient is in no acute distress.  Skin: Skin is warm and dry. No rash noted.   Cardiovascular: Normal heart rate noted  Respiratory: Normal respiratory effort, no problems with respiration noted  Abdomen: Soft, gravid, appropriate for gestational age. Pain/Pressure: Absent     Pelvic:  Cervical exam deferred        Extremities: Normal range of motion.  Edema: Trace  Mental Status: Normal mood and affect. Normal behavior. Normal judgment and thought content.   Urinalysis:      Assessment and Plan:  Pregnancy: G1P0000 at 6628w5d  1. Supervision of high risk pregnancy, antepartum Routine care.   Preterm labor symptoms and general obstetric precautions including but not limited to vaginal bleeding, contractions, leaking of fluid and fetal movement were reviewed in detail with the patient. Please refer to After Visit Summary for other  counseling recommendations.  Return in about 3 weeks (around 01/21/2018).   Regina Shaffer, Regina Sturgeon, MD

## 2018-01-21 ENCOUNTER — Ambulatory Visit (INDEPENDENT_AMBULATORY_CARE_PROVIDER_SITE_OTHER): Payer: Medicaid Other | Admitting: Obstetrics & Gynecology

## 2018-01-21 VITALS — BP 103/70 | HR 82 | Wt 286.2 lb

## 2018-01-21 DIAGNOSIS — O99210 Obesity complicating pregnancy, unspecified trimester: Secondary | ICD-10-CM

## 2018-01-21 DIAGNOSIS — O0930 Supervision of pregnancy with insufficient antenatal care, unspecified trimester: Secondary | ICD-10-CM

## 2018-01-21 DIAGNOSIS — O0990 Supervision of high risk pregnancy, unspecified, unspecified trimester: Secondary | ICD-10-CM

## 2018-01-21 NOTE — Progress Notes (Signed)
PRENATAL VISIT NOTE  Subjective:  Regina Shaffer is a 22 y.o. G1P0000 at 3028w5d being seen today for ongoing prenatal care.  She is currently monitored for the following issues for this high-risk pregnancy and has Adjustment disorder with mixed disturbance of emotions and conduct; BMI 50.0-59.9, adult (HCC); Maternal morbid obesity, antepartum (HCC); Supervision of high risk pregnancy, antepartum; and Late prenatal care on their problem list.  Patient reports no complaints.  Contractions: Not present. Vag. Bleeding: None.  Movement: Present. Denies leaking of fluid.   The following portions of the patient's history were reviewed and updated as appropriate: allergies, current medications, past family history, past medical history, past social history, past surgical history and problem list. Problem list updated.  Objective:   Vitals:   01/21/18 1454  BP: 103/70  Pulse: 82  Weight: 286 lb 3.2 oz (129.8 kg)    Fetal Status: Fetal Heart Rate (bpm): 145   Movement: Present     General:  Alert, oriented and cooperative. Patient is in no acute distress.  Skin: Skin is warm and dry. No rash noted.   Cardiovascular: Normal heart rate noted  Respiratory: Normal respiratory effort, no problems with respiration noted  Abdomen: Soft, gravid, appropriate for gestational age.  Pain/Pressure: Absent     Pelvic: Cervical exam deferred        Extremities: Normal range of motion.  Edema: Trace  Mental Status:  Normal mood and affect. Normal behavior. Normal judgment and thought content.   Imaging: Kore[place] Mfm Ob Detail +14 Wk  Result Date: 12/27/2017 ----------------------------------------------------------------------  OBSTETRICS REPORT                      (Signed Final 12/27/2017 05:45 pm) ---------------------------------------------------------------------- Patient Info  ID #:       [id number]                          D.O.B.:  1996-04-01 (21 yrs)  Name:       Regina Shaffer                 Visit Date:  12/27/2017 03:51 pm ---------------------------------------------------------------------- Performed By  Performed By:     Emeline DarlingKasie E Kiser BS,      Ref. Address:     [address]                    [address]                                                             [address]                                                             [place], [place]  [address]  Attending:        Particia Nearing MD       Location:         [facility]  Referred By:      Baden Bing MD ---------------------------------------------------------------------- Orders   #  Description                                 Code   1  [place] MFM OB DETAIL +14 WK                     76811.01  ----------------------------------------------------------------------   #  Ordered By               Order #        Accession #    Episode #   1  Burns Bing          [id number]      [id number]     [id number]  ---------------------------------------------------------------------- Indications   [redacted] weeks gestation of pregnancy                Z3A.20   Obesity complicating pregnancy, second         O99.212   trimester   Encounter for antenatal screening for          Z36.3   malformations  ---------------------------------------------------------------------- OB History  Blood Type:            Height:  5'0"   Weight (lb):  280       BMI:  54.68  Gravidity:    1 ---------------------------------------------------------------------- Fetal Evaluation  Num Of Fetuses:     1  Fetal Heart         154  Rate(bpm):  Cardiac Activity:   Observed  Presentation:       Cephalic  Placenta:           Posterior, above cervical os  P. Cord Insertion:  Not well visualized  Amniotic Fluid  AFI FV:      Subjectively within normal limits                              Largest Pocket(cm)                              4.7 ---------------------------------------------------------------------- Biometry  BPD:      49.6  mm      G. Age:  21w 0d         82  %    CI:        78.88   %    70 - 86                                                          FL/HC:      19.1   %    16.8 - 19.8  HC:      176.6  mm     G. Age:  20w 1d         43  %  HC/AC:      1.13        1.09 - 1.39  AC:      156.8  mm     G. Age:  20w 6d         67  %    FL/BPD:     68.1   %  FL:       33.8  mm     G. Age:  20w 4d         59  %    FL/AC:      21.6   %    20 - 24  HUM:      32.4  mm     G. Age:  20w 6d         72  %  Est. FW:     370  gm    0 lb 13 oz      56  % ---------------------------------------------------------------------- Gestational Age  LMP:           22w 4d        Date:  07/22/17                 EDD:   04/28/18  U/S Today:     20w 5d                                        EDD:   05/11/18  Best:          20w 1d     Det. By:  U/S  (12/01/17)          EDD:   05/15/18 ---------------------------------------------------------------------- Anatomy  Cranium:               Appears normal         Aortic Arch:            Appears normal  Cavum:                 Appears normal         Ductal Arch:            Not well visualized  Ventricles:            Appears normal         Diaphragm:              Appears normal  Choroid Plexus:        Appears normal         Stomach:                Appears normal, left                                                                        sided  Cerebellum:            Appears normal         Abdomen:                Appears normal  Posterior Fossa:       Appears normal         Abdominal Wall:  Appears nml (cord                                                                        insert, abd wall)  Nuchal Fold:           Not applicable (>20    Cord Vessels:           Appears normal ([redacted]                         wks GA)                                        vessel cord)  Face:                  Orbits appear          Kidneys:                Appear normal                         normal  Lips:                  Appears normal          Bladder:                Appears normal  Thoracic:              Appears normal         Spine:                  Limited views                                                                        appear normal  Heart:                 Not well visualized    Upper Extremities:      Appears normal  RVOT:                  Not well visualized    Lower Extremities:      Appears normal  LVOT:                  Not well visualized  Other:  Parents do not wish to know sex of fetus. Female gender. Heels and 5th          digit visualized. Technically difficult due to maternal habitus and fetal          position. ---------------------------------------------------------------------- Cervix Uterus Adnexa  Cervix  Length:            3.7  cm.  Normal appearance by transabdominal scan.  Adnexa:       No abnormality visualized. ---------------------------------------------------------------------- Impression  Singleton intrauterine pregnancy at 20 weeks  1 day gestation  with fetal cardiac activity  Cephalic presentation  Normal detailed fetal anatomy; limited views of heart and  profile  Markers of aneuploidy: none  Normal amniotic fluid volume  Measurements consistent with prior US ---------------------------------------------------------------------- Recommendations  Follow-up ultrasound in 4 weeks to complete anatomy survey ----------------------------------------------------------------------                 Particia Nearing, MD Electronically Signed Final Report   12/27/2017 05:45 pm ----------------------------------------------------------------------   Assessment and Plan:  Pregnancy: G1P0000 at [redacted]w[redacted]d  1. Maternal morbid obesity, antepartum (HCC) Next growth scan scheduled on 01/24/18  2. Late prenatal care 3. Supervision of high risk pregnancy, antepartum Preterm labor symptoms and general obstetric precautions including but not limited to vaginal bleeding, contractions, leaking of fluid and fetal movement were reviewed  in detail with the patient. Please refer to After Visit Summary for other counseling recommendations.  Return in about 4 weeks (around 02/18/2018) for 2 hr GTT, 3rd trimester labs, TDap, OB Visit.   Jaynie Collins, MD

## 2018-01-21 NOTE — Patient Instructions (Signed)
Return to clinic for any scheduled appointments or obstetric concerns, or go to [facility] for evaluation  TDaP Vaccine Pregnancy Get the Whooping Cough Vaccine While You Are Pregnant (CDC)  It is important for women to get the whooping cough vaccine in the third trimester of each pregnancy. Vaccines are the best way to prevent this disease. There are 2 different whooping cough vaccines. Both vaccines combine protection against whooping cough, tetanus and diphtheria, but they are for different age groups: Tdap: for everyone 11 years or older, including pregnant women  DTaP: for children 2 months through 6 years of age  You need the whooping cough vaccine during each of your pregnancies The recommended time to get the shot is during your 27th through 36th week of pregnancy, preferably during the earlier part of this time period. The Centers for Disease Control and Prevention (CDC) recommends that pregnant women receive the whooping cough vaccine for adolescents and adults (called Tdap vaccine) during the third trimester of each pregnancy. The recommended time to get the shot is during your 27th through 36th week of pregnancy, preferably during the earlier part of this time period. This replaces the original recommendation that pregnant women get the vaccine only if they had not previously received it. The American College of Obstetricians and Gynecologists and the American College of Nurse-Midwives support this recommendation.  You should get the whooping cough vaccine while pregnant to pass protection to your baby frame support disabled and/or not supported in this browser  Learn why Laura decided to get the whooping cough vaccine in her 3rd trimester of pregnancy and how her baby girl was born with some protection against the disease. Also available on YouTube. After receiving the whooping cough vaccine, your body will create protective antibodies (proteins produced by the body to fight off diseases) and  pass some of them to your baby before birth. These antibodies provide your baby some short-term protection against whooping cough in early life. These antibodies can also protect your baby from some of the more serious complications that come along with whooping cough. Your protective antibodies are at their highest about 2 weeks after getting the vaccine, but it takes time to pass them to your baby. So the preferred time to get the whooping cough vaccine is early in your third trimester. The amount of whooping cough antibodies in your body decreases over time. That is why CDC recommends you get a whooping cough vaccine during each pregnancy. Doing so allows each of your babies to get the greatest number of protective antibodies from you. This means each of your babies will get the best protection possible against this disease.  Getting the whooping cough vaccine while pregnant is better than getting the vaccine after you give birth Whooping cough vaccination during pregnancy is ideal so your baby will have short-term protection as soon as he is born. This early protection is important because your baby will not start getting his whooping cough vaccines until he is 2 months old. These first few months of life are when your baby is at greatest risk for catching whooping cough. This is also when he's at greatest risk for having severe, potentially life-threating complications from the infection. To avoid that gap in protection, it is best to get a whooping cough vaccine during pregnancy. You will then pass protection to your baby before he is born. To continue protecting your baby, he should get whooping cough vaccines starting at 2 months old. You may never have gotten the   Tdap vaccine before and did not get it during this pregnancy. If so, you should make sure to get the vaccine immediately after you give birth, before leaving the hospital or birthing center. It will take about 2 weeks before your body  develops protection (antibodies) in response to the vaccine. Once you have protection from the vaccine, you are less likely to give whooping cough to your newborn while caring for him. But remember, your baby will still be at risk for catching whooping cough from others. A recent study looked to see how effective Tdap was at preventing whooping cough in babies whose mothers got the vaccine while pregnant or in the hospital after giving birth. The study found that getting Tdap between 27 through 36 weeks of pregnancy is 85% more effective at preventing whooping cough in babies younger than 2 months old. Blood tests cannot tell if you need a whooping cough vaccine There are no blood tests that can tell you if you have enough antibodies in your body to protect yourself or your baby against whooping cough. Even if you have been sick with whooping cough in the past or previously received the vaccine, you still should get the vaccine during each pregnancy. Breastfeeding may pass some protective antibodies onto your baby By breastfeeding, you may pass some antibodies you have made in response to the vaccine to your baby. When you get a whooping cough vaccine during your pregnancy, you will have antibodies in your breast milk that you can share with your baby as soon as your milk comes in. However, your baby will not get protective antibodies immediately if you wait to get the whooping cough vaccine until after delivering your baby. This is because it takes about 2 weeks for your body to create antibodies. Learn more about the health benefits of breastfeeding. 

## 2018-01-24 ENCOUNTER — Other Ambulatory Visit (HOSPITAL_COMMUNITY): Payer: Self-pay | Admitting: Maternal and Fetal Medicine

## 2018-01-24 ENCOUNTER — Encounter (HOSPITAL_COMMUNITY): Payer: Self-pay

## 2018-01-24 ENCOUNTER — Ambulatory Visit (HOSPITAL_COMMUNITY)
Admission: RE | Admit: 2018-01-24 | Discharge: 2018-01-24 | Disposition: A | Discharge status: Home / Self Care | Payer: Medicaid Other | Source: Ambulatory Visit | Attending: Obstetrics and Gynecology | Admitting: Obstetrics and Gynecology

## 2018-01-24 DIAGNOSIS — Z3A24 24 weeks gestation of pregnancy: Secondary | ICD-10-CM

## 2018-01-24 DIAGNOSIS — O99212 Obesity complicating pregnancy, second trimester: Secondary | ICD-10-CM | POA: Insufficient documentation

## 2018-01-24 DIAGNOSIS — Z362 Encounter for other antenatal screening follow-up: Secondary | ICD-10-CM | POA: Diagnosis not present

## 2018-02-18 ENCOUNTER — Encounter: Payer: Self-pay | Admitting: Obstetrics & Gynecology

## 2018-02-19 ENCOUNTER — Ambulatory Visit: Payer: Medicaid Other | Admitting: Podiatry

## 2018-02-21 ENCOUNTER — Encounter: Payer: Self-pay | Admitting: Obstetrics & Gynecology

## 2018-02-24 ENCOUNTER — Telehealth: Payer: Self-pay | Admitting: *Deleted

## 2018-02-24 ENCOUNTER — Ambulatory Visit: Payer: Medicaid Other | Admitting: Neurology

## 2018-02-24 NOTE — Telephone Encounter (Signed)
No showed new patient appointment. 

## 2018-02-25 ENCOUNTER — Encounter: Payer: Self-pay | Admitting: Neurology

## 2018-03-09 ENCOUNTER — Inpatient Hospital Stay (HOSPITAL_COMMUNITY)
Admission: AD | Admit: 2018-03-09 | Discharge: 2018-03-09 | Disposition: A | Discharge status: Home / Self Care | Payer: Medicaid Other | Source: Ambulatory Visit | Attending: Obstetrics & Gynecology | Admitting: Obstetrics & Gynecology

## 2018-03-09 ENCOUNTER — Encounter (HOSPITAL_COMMUNITY): Payer: Self-pay | Admitting: *Deleted

## 2018-03-09 DIAGNOSIS — Z8719 Personal history of other diseases of the digestive system: Secondary | ICD-10-CM | POA: Insufficient documentation

## 2018-03-09 DIAGNOSIS — O4703 False labor before 37 completed weeks of gestation, third trimester: Secondary | ICD-10-CM | POA: Diagnosis not present

## 2018-03-09 DIAGNOSIS — Z3A30 30 weeks gestation of pregnancy: Secondary | ICD-10-CM

## 2018-03-09 DIAGNOSIS — N898 Other specified noninflammatory disorders of vagina: Secondary | ICD-10-CM

## 2018-03-09 DIAGNOSIS — Z87891 Personal history of nicotine dependence: Secondary | ICD-10-CM | POA: Diagnosis not present

## 2018-03-09 DIAGNOSIS — O479 False labor, unspecified: Secondary | ICD-10-CM | POA: Diagnosis not present

## 2018-03-09 DIAGNOSIS — Z9889 Other specified postprocedural states: Secondary | ICD-10-CM | POA: Insufficient documentation

## 2018-03-09 DIAGNOSIS — O26893 Other specified pregnancy related conditions, third trimester: Secondary | ICD-10-CM

## 2018-03-09 LAB — URINALYSIS, ROUTINE W REFLEX MICROSCOPIC
BILIRUBIN URINE: NEGATIVE
Glucose, UA: NEGATIVE mg/dL
HGB URINE DIPSTICK: NEGATIVE
KETONES UR: NEGATIVE mg/dL
NITRITE: NEGATIVE
PROTEIN: NEGATIVE mg/dL
Specific Gravity, Urine: 1.017 (ref 1.005–1.030)
pH: 6 (ref 5.0–8.0)

## 2018-03-09 NOTE — Discharge Instructions (Signed)
Braxton Hicks Contractions °Contractions of the uterus can occur throughout pregnancy, but they are not always a sign that you are in labor. You may have practice contractions called Braxton Hicks contractions. These false labor contractions are sometimes confused with true labor. °What are Braxton Hicks contractions? °Braxton Hicks contractions are tightening movements that occur in the muscles of the uterus before labor. Unlike true labor contractions, these contractions do not result in opening (dilation) and thinning of the cervix. Toward the end of pregnancy (32-34 weeks), Braxton Hicks contractions can happen more often and may become stronger. These contractions are sometimes difficult to tell apart from true labor because they can be very uncomfortable. You should not feel embarrassed if you go to the hospital with false labor. °Sometimes, the only way to tell if you are in true labor is for your health care provider to look for changes in the cervix. The health care provider will do a physical exam and may monitor your contractions. If you are not in true labor, the exam should show that your cervix is not dilating and your water has not broken. °If there are other health problems associated with your pregnancy, it is completely safe for you to be sent home with false labor. You may continue to have Braxton Hicks contractions until you go into true labor. °How to tell the difference between true labor and false labor °True labor °· Contractions last 30-70 seconds. °· Contractions become very regular. °· Discomfort is usually felt in the top of the uterus, and it spreads to the lower abdomen and low back. °· Contractions do not go away with walking. °· Contractions usually become more intense and increase in frequency. °· The cervix dilates and gets thinner. °False labor °· Contractions are usually shorter and not as strong as true labor contractions. °· Contractions are usually irregular. °· Contractions  are often felt in the front of the lower abdomen and in the groin. °· Contractions may go away when you walk around or change positions while lying down. °· Contractions get weaker and are shorter-lasting as time goes on. °· The cervix usually does not dilate or become thin. °Follow these instructions at home: °· Take over-the-counter and prescription medicines only as told by your health care provider. °· Keep up with your usual exercises and follow other instructions from your health care provider. °· Eat and drink lightly if you think you are going into labor. °· If Braxton Hicks contractions are making you uncomfortable: °? Change your position from lying down or resting to walking, or change from walking to resting. °? Sit and rest in a tub of warm water. °? Drink enough fluid to keep your urine pale yellow. Dehydration may cause these contractions. °? Do slow and deep breathing several times an hour. °· Keep all follow-up prenatal visits as told by your health care provider. This is important. °Contact a health care provider if: °· You have a fever. °· You have continuous pain in your abdomen. °Get help right away if: °· Your contractions become stronger, more regular, and closer together. °· You have fluid leaking or gushing from your vagina. °· You pass blood-tinged mucus (bloody show). °· You have bleeding from your vagina. °· You have low back pain that you never had before. °· You feel your baby’s head pushing down and causing pelvic pressure. °· Your baby is not moving inside you as much as it used to. °Summary °· Contractions that occur before labor are called Braxton   Hicks contractions, false labor, or practice contractions. °· Braxton Hicks contractions are usually shorter, weaker, farther apart, and less regular than true labor contractions. True labor contractions usually become progressively stronger and regular and they become more frequent. °· Manage discomfort from Braxton Hicks contractions by  changing position, resting in a warm bath, drinking plenty of water, or practicing deep breathing. °This information is not intended to replace advice given to you by your health care provider. Make sure you discuss any questions you have with your health care provider. °Document Released: 03/28/2017 Document Revised: 03/28/2017 Document Reviewed: 03/28/2017 °Elsevier Interactive Patient Education © 2018 [employer] ° ° °Abdominal Pain During Pregnancy °Abdominal pain is common in pregnancy. Most of the time, it does not cause harm. There are many causes of abdominal pain. Some causes are more serious than others and sometimes the cause is not known. Abdominal pain can be a sign that something is very wrong with the pregnancy or the pain may have nothing to do with the pregnancy. Always tell your health care provider if you have any abdominal pain. °Follow these instructions at home: °· Do not have sex or put anything in your vagina until your symptoms go away completely. °· Watch your abdominal pain for any changes. °· Get plenty of rest until your pain improves. °· Drink enough fluid to keep your urine clear or pale yellow. °· Take over-the-counter or prescription medicines only as told by your health care provider. °· Keep all follow-up visits as told by your health care provider. This is important. °Contact a health care provider if: °· You have a fever. °· Your pain gets worse or you have cramping. °· Your pain continues after resting. °Get help right away if: °· You are bleeding, leaking fluid, or passing tissue from the vagina. °· You have vomiting or diarrhea that does not go away. °· You have painful or bloody urination. °· You notice a decrease in your baby's movements. °· You feel very weak or faint. °· You have shortness of breath. °· You develop a severe headache with abdominal pain. °· You have abnormal vaginal discharge with abdominal pain. °This information is not intended to replace advice given  to you by your health care provider. Make sure you discuss any questions you have with your health care provider. °Document Released: 11/12/2005 Document Revised: 08/23/2016 Document Reviewed: 06/11/2013 °Elsevier Interactive Patient Education © 2018 [employer] ° °

## 2018-03-09 NOTE — [facility] Note (Signed)
Think I lost my mucous plug about an hour ago. I have been crampy for a wk. Was worse yesterday and thought maybe I was constipated. Denies LOF or vag bleeding

## 2018-03-09 NOTE — MAU Provider Note (Signed)
History     CSN: [id number]  Arrival date and time: 03/09/18 0028   First Provider Initiated Contact with Patient 03/09/18 0103     Chief Complaint  Patient presents with  . Vaginal Discharge  . Contractions   HPI Regina Shaffer is a 22 y.o. G1P0000 at [redacted]w[redacted]d who presents stating she lost her mucous plug around midnight. She denies any other discharge, leaking or vaginal bleeding. She also states she was having contractions yesterday but none today. She denies any pain. Reports good fetal movement.   OB History    Gravida  1   Para  0   Term  0   Preterm  0   AB  0   Living  0     SAB  0   TAB  0   Ectopic  0   Multiple  0   Live Births  0           Past Medical History:  Diagnosis Date  . Chlamydia   . Gastric ulcer   . GERD (gastroesophageal reflux disease)   . Hypotension     Past Surgical History:  Procedure Laterality Date  . MYRINGOTOMY      Family History  Problem Relation Age of Onset  . Hypertension Maternal Grandmother   . Diabetes Maternal Grandmother     Social History   Tobacco Use  . Smoking status: Former Smoker    Packs/day: 1.00    Types: Cigarettes  . Smokeless tobacco: Never Used  Substance Use Topics  . Alcohol use: No  . Drug use: No    Allergies: No Known Allergies  Medications Prior to Admission  Medication Sig Dispense Refill Last Dose  . Prenatal Vit-Fe Fumarate-FA (PRENATAL VITAMIN PO) Take by mouth.   Taking    Review of Systems  Constitutional: Negative.  Negative for fatigue and fever.  HENT: Negative.   Respiratory: Negative.  Negative for shortness of breath.   Cardiovascular: Negative.  Negative for chest pain.  Gastrointestinal: Negative.  Negative for abdominal pain, constipation, diarrhea, nausea and vomiting.  Genitourinary: Positive for vaginal discharge. Negative for dysuria.  Neurological: Negative.  Negative for dizziness and headaches.   Physical Exam   Blood pressure 124/71, pulse  (!) 111, temperature (!) 97.5 F (36.4 C), resp. rate 20, height 5' (1.524 m), weight 290 lb (131.5 kg), last menstrual period 07/22/2017.  Physical Exam  Nursing note and vitals reviewed. Constitutional: She is oriented to person, place, and time. She appears well-developed and well-nourished. No distress.  HENT:  Head: Normocephalic.  Eyes: Pupils are equal, round, and reactive to light.  Cardiovascular: Normal rate, regular rhythm and normal heart sounds.  Respiratory: Effort normal and breath sounds normal. No respiratory distress.  GI: Soft. Bowel sounds are normal. She exhibits no distension. There is no tenderness.  Neurological: She is alert and oriented to person, place, and time.  Skin: Skin is warm and dry.  Psychiatric: She has a normal mood and affect. Her behavior is normal. Judgment and thought content normal.   Dilation: Closed Effacement (%): Thick Cervical Position: Posterior Exam by:: C.neil,CNM  Fetal Tracing:  Baseline: 130 Variability: moderate Accels: 15x15 Decels: none  Toco: none   MAU Course  Procedures Results for orders placed or performed during the hospital encounter of 03/09/18 (from the past 24 hour(s))  Urinalysis, Routine w reflex microscopic     Status: Abnormal   Collection Time: 03/09/18 12:45 AM  Result Value Ref Range  Color, Urine YELLOW YELLOW   APPearance HAZY (A) CLEAR   Specific Gravity, Urine 1.017 1.005 - 1.030   pH 6.0 5.0 - 8.0   Glucose, UA NEGATIVE NEGATIVE mg/dL   Hgb urine dipstick NEGATIVE NEGATIVE   Bilirubin Urine NEGATIVE NEGATIVE   Ketones, ur NEGATIVE NEGATIVE mg/dL   Protein, ur NEGATIVE NEGATIVE mg/dL   Nitrite NEGATIVE NEGATIVE   Leukocytes, UA TRACE (A) NEGATIVE   RBC / HPF 0-5 0 - 5 RBC/hpf   WBC, UA 0-5 0 - 5 WBC/hpf   Bacteria, UA RARE (A) NONE SEEN   Squamous Epithelial / LPF 6-30 (A) NONE SEEN   Mucus PRESENT    MDM UA  Assessment and Plan   1. Vaginal discharge during pregnancy in third  trimester   2. Braxton Hick's contraction   3. [redacted] weeks gestation of pregnancy    -Discharge home in stable condition -Preterm labor precautions discussed -Patient advised to [facility] to [facility] care, message sent -Patient may return to MAU as needed or if her condition were to change or worsen  Rolm BookbinderCaroline M Alandria Butkiewicz CNM 03/09/2018, 1:10 AM

## 2018-03-10 ENCOUNTER — Encounter (INDEPENDENT_AMBULATORY_CARE_PROVIDER_SITE_OTHER): Payer: Self-pay

## 2018-03-12 ENCOUNTER — Ambulatory Visit (INDEPENDENT_AMBULATORY_CARE_PROVIDER_SITE_OTHER): Payer: Medicaid Other | Admitting: Family Medicine

## 2018-03-12 ENCOUNTER — Encounter: Payer: Self-pay | Admitting: Family Medicine

## 2018-03-12 VITALS — BP 109/74 | HR 101 | Wt 286.0 lb

## 2018-03-12 DIAGNOSIS — O99212 Obesity complicating pregnancy, second trimester: Secondary | ICD-10-CM

## 2018-03-12 DIAGNOSIS — O99210 Obesity complicating pregnancy, unspecified trimester: Secondary | ICD-10-CM

## 2018-03-12 DIAGNOSIS — Z3402 Encounter for supervision of normal first pregnancy, second trimester: Secondary | ICD-10-CM

## 2018-03-12 DIAGNOSIS — Z6841 Body Mass Index (BMI) 40.0 and over, adult: Secondary | ICD-10-CM

## 2018-03-12 DIAGNOSIS — O0990 Supervision of high risk pregnancy, unspecified, unspecified trimester: Secondary | ICD-10-CM

## 2018-03-12 DIAGNOSIS — O0932 Supervision of pregnancy with insufficient antenatal care, second trimester: Secondary | ICD-10-CM

## 2018-03-12 DIAGNOSIS — O0930 Supervision of pregnancy with insufficient antenatal care, unspecified trimester: Secondary | ICD-10-CM

## 2018-03-12 DIAGNOSIS — O0992 Supervision of high risk pregnancy, unspecified, second trimester: Secondary | ICD-10-CM

## 2018-03-12 NOTE — Progress Notes (Signed)
   PRENATAL VISIT NOTE  Subjective:  Regina Shaffer is a 22 y.o. G1P0000 at 2965w6d being seen today for ongoing prenatal care.  She is currently monitored for the following issues for this low-risk pregnancy and has Adjustment disorder with mixed disturbance of emotions and conduct; BMI 50.0-59.9, adult (HCC); Maternal morbid obesity, antepartum (HCC); Supervision of high risk pregnancy, antepartum; and Late prenatal care on their problem list.  Patient reports lost mucous plug.  Contractions: Not present. Vag. Bleeding: None.  Movement: Present. Denies leaking of fluid.   The following portions of the patient's history were reviewed and updated as appropriate: allergies, current medications, past family history, past medical history, past social history, past surgical history and problem list. Problem list updated.  Objective:   Vitals:   03/12/18 0933  BP: 109/74  Pulse: (!) 101  Weight: 286 lb (129.7 kg)    Fetal Status: Fetal Heart Rate (bpm): 145 Fundal Height: 31 cm Movement: Present  Presentation: Vertex  General:  Alert, oriented and cooperative. Patient is in no acute distress.  Skin: Skin is warm and dry. No rash noted.   Cardiovascular: Normal heart rate noted  Respiratory: Normal respiratory effort, no problems with respiration noted  Abdomen: Soft, gravid, appropriate for gestational age.  Pain/Pressure: Absent     Pelvic: Cervical exam performed Dilation: Closed Effacement (%): 50 Station: -3  Extremities: Normal range of motion.  Edema: Trace  Mental Status: Normal mood and affect. Normal behavior. Normal judgment and thought content.   Assessment and Plan:  Pregnancy: G1P0000 at 3365w6d  1. Encounter for supervision of normal first pregnancy in second trimester - Glucose Tolerance, 2 Hours w/1 Hour - RPR; Future - HIV antibody; Future - HIV antibody - RPR - CBC  2. Supervision of high risk pregnancy, antepartum UTD- declined Tdap  3. Late prenatal care  4.  Maternal morbid obesity, antepartum (HCC)  5. BMI 50.0-59.9, adult (HCC) TWG= 11 lb (4.99 kg), Recommend 11-15 TWG for pregnancy.   Preterm labor symptoms and general obstetric precautions including but not limited to vaginal bleeding, contractions, leaking of fluid and fetal movement were reviewed in detail with the patient. Please refer to After Visit Summary for other counseling recommendations.  Return in about 2 weeks (around 03/26/2018) for Routine prenatal care.  No future appointments.  Federico FlakeKimberly Niles Austen Oyster, MD

## 2018-03-13 LAB — CBC
HEMATOCRIT: 33.3 % — AB (ref 34.0–46.6)
Hemoglobin: 10.8 g/dL — ABNORMAL LOW (ref 11.1–15.9)
MCH: 25.4 pg — ABNORMAL LOW (ref 26.6–33.0)
MCHC: 32.4 g/dL (ref 31.5–35.7)
MCV: 78 fL — AB (ref 79–97)
PLATELETS: 271 10*3/uL (ref 150–379)
RBC: 4.26 x10E6/uL (ref 3.77–5.28)
RDW: 14.7 % (ref 12.3–15.4)
WBC: 9.3 10*3/uL (ref 3.4–10.8)

## 2018-03-13 LAB — GLUCOSE TOLERANCE, 2 HOURS W/ 1HR
GLUCOSE, 1 HOUR: 103 mg/dL (ref 65–179)
Glucose, 2 hour: 97 mg/dL (ref 65–152)
Glucose, Fasting: 89 mg/dL (ref 65–91)

## 2018-03-13 LAB — HIV ANTIBODY (ROUTINE TESTING W REFLEX): HIV Screen 4th Generation wRfx: NONREACTIVE

## 2018-03-13 LAB — RPR: RPR: NONREACTIVE

## 2018-03-26 ENCOUNTER — Inpatient Hospital Stay (HOSPITAL_COMMUNITY)
Admission: AD | Admit: 2018-03-26 | Discharge: 2018-03-26 | Disposition: A | Discharge status: Home / Self Care | Payer: Medicaid Other | Source: Ambulatory Visit | Attending: Obstetrics and Gynecology | Admitting: Obstetrics and Gynecology

## 2018-03-26 ENCOUNTER — Encounter (HOSPITAL_COMMUNITY): Payer: Self-pay | Admitting: *Deleted

## 2018-03-26 DIAGNOSIS — O9989 Other specified diseases and conditions complicating pregnancy, childbirth and the puerperium: Secondary | ICD-10-CM | POA: Diagnosis not present

## 2018-03-26 DIAGNOSIS — Z3A33 33 weeks gestation of pregnancy: Secondary | ICD-10-CM | POA: Diagnosis not present

## 2018-03-26 DIAGNOSIS — Z87891 Personal history of nicotine dependence: Secondary | ICD-10-CM | POA: Diagnosis not present

## 2018-03-26 DIAGNOSIS — O23593 Infection of other part of genital tract in pregnancy, third trimester: Secondary | ICD-10-CM | POA: Diagnosis not present

## 2018-03-26 DIAGNOSIS — R102 Pelvic and perineal pain: Secondary | ICD-10-CM | POA: Diagnosis present

## 2018-03-26 DIAGNOSIS — N751 Abscess of Bartholin's gland: Secondary | ICD-10-CM | POA: Diagnosis not present

## 2018-03-26 MED ORDER — CLINDAMYCIN HCL 150 MG PO CAPS: 150.0000 mg | ORAL_CAPSULE | Freq: Four times a day (QID) | ORAL | 0 refills | Status: DC

## 2018-03-26 MED ORDER — AMOXICILLIN 875 MG PO TABS: 875.0000 mg | ORAL_TABLET | Freq: Two times a day (BID) | ORAL | 0 refills | Status: AC

## 2018-03-26 NOTE — MAU Note (Signed)
Pt here with complaints of vaginal pain and a boil that appeared yesterday. Pt states that it hurts to touch. Denies any drainage. Pt has never had anything like this before. Tried warm compresses-helped for the time being

## 2018-03-26 NOTE — MAU Provider Note (Signed)
  History     CSN: [id number]  Arrival date and time: 03/26/18 7829   First Provider Initiated Contact with Patient 03/26/18 0234      Chief Complaint  Patient presents with  . Pain    LT labial   HPI  Ms.  Regina Shaffer is a 22 y.o. year old G49P0000 female at [redacted]w[redacted]d weeks gestation who presents to MAU reporting vaginal pain and a "boil" that appeared yesterday. She reports the bump is painful to touch. She denies drainage. She denies having anything like this before. She tried warm compresses; which "temporarily helped". She report good (+) FM today.   Past Medical History:  Diagnosis Date  . Chlamydia   . Gastric ulcer   . GERD (gastroesophageal reflux disease)   . Hypotension     Past Surgical History:  Procedure Laterality Date  . MYRINGOTOMY      Family History  Problem Relation Age of Onset  . Hypertension Maternal Grandmother   . Diabetes Maternal Grandmother     Social History   Tobacco Use  . Smoking status: Former Smoker    Packs/day: 1.00    Types: Cigarettes  . Smokeless tobacco: Never Used  Substance Use Topics  . Alcohol use: No  . Drug use: No    Allergies: No Known Allergies  Medications Prior to Admission  Medication Sig Dispense Refill Last Dose  . Prenatal Vit-Fe Fumarate-FA (PRENATAL VITAMIN PO) Take by mouth.   Taking    Review of Systems  Constitutional: Negative.   HENT: Negative.   Eyes: Negative.   Respiratory: Negative.   Cardiovascular: Negative.   Gastrointestinal: Negative.   Endocrine: Negative.   Genitourinary: Positive for vaginal pain (painful bump on labia; noticed yesterday).  Musculoskeletal: Negative.   Skin: Negative.   Allergic/Immunologic: Negative.   Neurological: Negative.   Hematological: Negative.   Psychiatric/Behavioral: Negative.    Physical Exam   Blood pressure 120/75, pulse 98, temperature 98.4 F (36.9 C), resp. rate 19, height 5' (1.524 m), weight 292 lb (132.5 kg), last menstrual period  07/22/2017, SpO2 97 %.  Physical Exam  Nursing note and vitals reviewed. Constitutional: She is oriented to person, place, and time. She appears well-developed and well-nourished.  HENT:  Head: Normocephalic and atraumatic.  Eyes: Pupils are equal, round, and reactive to light.  Neck: Normal range of motion.  Cardiovascular: Normal rate, regular rhythm and normal heart sounds.  Respiratory: Effort normal and breath sounds normal.  GI: Soft. Bowel sounds are normal.  Genitourinary:  Genitourinary Comments: LT labial bump more than likely Bartholin's gland abscess; firm, tender with palpation; not ready for I&D  Musculoskeletal: Normal range of motion.  Neurological: She is alert and oriented to person, place, and time.  Skin: Skin is warm and dry.  Psychiatric: She has a normal mood and affect. Her behavior is normal. Judgment and thought content normal.    MAU Course  Procedures  MDM Visual exam of external labia/perineum NST - FHR: 140 bpm / moderate variability / accels present / decels absent / TOCO: 3 UC's with UI noted   Assessment and Plan  Bartholin's gland abscess  - Clindamycin 300 mg QID and Amoxicillin 875 mg  - Information provided on Bartholin's gland abscess  - Discharge patient - Patient verbalized an understanding of the plan of care and agrees.   Raelyn Mora, MSN, CNM 03/26/2018, 2:42 AM

## 2018-04-02 ENCOUNTER — Ambulatory Visit (INDEPENDENT_AMBULATORY_CARE_PROVIDER_SITE_OTHER): Payer: Medicaid Other | Admitting: Student

## 2018-04-02 ENCOUNTER — Encounter: Payer: Self-pay | Admitting: Student

## 2018-04-02 VITALS — BP 111/72 | HR 72 | Wt 292.4 lb

## 2018-04-02 DIAGNOSIS — O0990 Supervision of high risk pregnancy, unspecified, unspecified trimester: Secondary | ICD-10-CM

## 2018-04-02 DIAGNOSIS — O99210 Obesity complicating pregnancy, unspecified trimester: Secondary | ICD-10-CM

## 2018-04-02 NOTE — Progress Notes (Signed)
   PRENATAL VISIT NOTE  Subjective:  Regina Shaffer is a 22 y.o. G1P0000 at [redacted]w[redacted]d being seen today for ongoing prenatal care.  She is currently monitored for the following issues for this low-risk pregnancy and has Adjustment disorder with mixed disturbance of emotions and conduct; BMI 50.0-59.9, adult (HCC); Maternal morbid obesity, antepartum (HCC); Supervision of high risk pregnancy, antepartum; Late prenatal care; and Bartholin's gland abscess on their problem list.  Patient reports no complaints.  Contractions: Not present.  .  Movement: Present. Denies leaking of fluid.   The following portions of the patient's history were reviewed and updated as appropriate: allergies, current medications, past family history, past medical history, past social history, past surgical history and problem list. Problem list updated.  Objective:   Vitals:   04/02/18 1538  BP: 111/72  Pulse: 72  Weight: 292 lb 6.4 oz (132.6 kg)    Fetal Status: Fetal Heart Rate (bpm): 140 Fundal Height: 34 cm Movement: Present     General:  Alert, oriented and cooperative. Patient is in no acute distress.  Skin: Skin is warm and dry. No rash noted.   Cardiovascular: Normal heart rate noted  Respiratory: Normal respiratory effort, no problems with respiration noted  Abdomen: Soft, gravid, appropriate for gestational age.  Pain/Pressure: Absent     Pelvic: Cervical exam deferred        Extremities: Normal range of motion.  Edema: Trace  Mental Status: Normal mood and affect. Normal behavior. Normal judgment and thought content.   Assessment and Plan:  Pregnancy: G1P0000 at [redacted]w[redacted]d  1. Supervision of high risk pregnancy, antepartum -Reviewed glucose tolerance test results  2. Maternal morbid obesity, antepartum (HCC) -Discussed cutting back from two Dr. Pat Kocher a day to one; emphasized importance of changing her diet now to prevent DM and HTN later -Patient had many questions about SIDS; information from [facility]  clinic reviewed. Patient and visitor verbalized understanding.  -Patient requesting [place] today; explained that it is not medically indicated at this time.   Preterm labor symptoms and general obstetric precautions including but not limited to vaginal bleeding, contractions, leaking of fluid and fetal movement were reviewed in detail with the patient. Please refer to After Visit Summary for other counseling recommendations.  Return in about 2 weeks (around 04/16/2018), or [facility].  Future Appointments  Date Time Provider Department Center  04/17/2018 11:30 AM Reva Bores, MD [facility]    Marylene Land, [place]

## 2018-04-02 NOTE — Patient Instructions (Addendum)
Sudden infant death syndrome (SIDS) Overview Sudden infant death syndrome (SIDS) is the unexplained death, usually during sleep, of a seemingly healthy baby less than a year old. SIDS is sometimes known as crib death because the infants often die in their cribs. Although the cause is unknown, it appears that SIDS might be associated with defects in the portion of an infant's brain that controls breathing and arousal from sleep. Researchers have discovered some factors that might put babies at extra risk. They've also identified measures you can take to help protect your child from SIDS. Perhaps the most important is placing your baby on his or her back to sleep. Causes A combination of physical and sleep environmental factors can make an infant more vulnerable to SIDS. These factors vary from child to child. Physical factors Physical factors associated with SIDS include: Brain defects. Some infants are born with problems that make them more likely to die of SIDS. In many of these babies, the portion of the brain that controls breathing and arousal from sleep hasn't matured enough to work properly.  Low birth weight. Premature birth or being part of a multiple birth increases the likelihood that a baby's brain hasn't matured completely, so he or she has less control over such automatic processes as breathing and heart rate.  Respiratory infection. Many infants who died of SIDS had recently had a cold, which might contribute to breathing problems. Sleep environmental factors The items in a baby's crib and his or her sleeping position can combine with a baby's physical problems to increase the risk of SIDS. Examples include: Sleeping on the stomach or side. Babies placed in these positions to sleep might have more difficulty breathing than those placed on their backs.  Sleeping on a soft surface. Lying face down on a fluffy comforter, a soft mattress or a waterbed can block an infant's airway.   Sharing a bed. While the risk of SIDS is lowered if an infant sleeps in the same room as his or her parents, the risk increases if the baby sleeps in the same bed with parents, siblings or pets.  Overheating. Being too warm while sleeping can increase a baby's risk of SIDS. Risk factors Although sudden infant death syndrome can strike any infant, researchers have identified several factors that might increase a baby's risk. They include: Sex. Boys are slightly more likely to die of SIDS.  Age. Infants are most vulnerable between the second and fourth months of life.  Race. For reasons that aren't well-understood, nonwhite infants are more likely to develop SIDS.  Family history. Babies who've had siblings or cousins die of SIDS are at higher risk of SIDS.  Secondhand smoke. Babies who live with smokers have a higher risk of SIDS.  Being premature. Both being born early and having a low birth weight increase your baby's chances of SIDS. Maternal risk factors During pregnancy, the mother also affects her baby's risk of SIDS, especially if she: Is younger than 77  Smokes cigarettes  Uses drugs or alcohol  Has inadequate prenatal care Prevention There's no guaranteed way to prevent SIDS, but you can help your baby sleep more safely by following these tips: Back to sleep. Place your baby to sleep on his or her back, rather than on the stomach or side, every time you - or anyone else - put the baby to sleep for the first year of life. This isn't necessary when your baby's awake or able to roll over both ways without help.  Don't assume that others will place your baby to sleep in the correct position - insist on it. Advise sitters and child care providers not to use the stomach position to calm an upset baby. Keep the crib as bare as possible. Use a firm mattress and avoid placing your baby on thick, fluffy padding, such as lambskin or a thick quilt. Don't leave pillows, fluffy toys or stuffed animals  in the crib. These can interfere with breathing if your baby's face presses against them.  Don't overheat your baby. To keep your baby warm, try a sleep sack or other sleep clothing that doesn't require additional covers. Don't cover your baby's head.  Have your baby sleep in in your room. Ideally, your baby should sleep in your room with you, but alone in a crib, bassinet or other structure designed for infants, for at least six months, and, if possible, up to a year. Adult beds aren't safe for infants. A baby can become trapped and suffocate between the headboard slats, the space between the mattress and the bed frame, or the space between the mattress and the wall. A baby can also suffocate if a sleeping parent accidentally rolls over and covers the baby's nose and mouth. Breast-feed your baby, if possible. Breast-feeding for at least six months lowers the risk of SIDS.  Don't use baby monitors and other commercial devices that claim to reduce the risk of SIDS. The American Academy of Pediatrics discourages the use of monitors and other devices because of ineffectiveness and safety issues.  Offer a pacifier. Sucking on a pacifier without a strap or string at naptime and bedtime might reduce the risk of SIDS. One caveat - if you're breast-feeding, wait to offer a pacifier until your baby is 53 to 68 weeks old and you've settled into a nursing routine. If your baby's not interested in the pacifier, don't force it. Try again another day. If the pacifier falls out of your baby's mouth while he or she is sleeping, don't pop it back in. Immunize your baby. There's no evidence that routine immunizations increase SIDS risk. Some evidence indicates immunizations can help prevent SIDS. By [facility] Staff  .  [facility] Footer Legal Conditions and Terms Any use of this site constitutes your agreement to the Terms and Conditions and Privacy Policy linked below.  Terms and Conditions  Privacy Policy  Notice  of Privacy Practices  Notice of Nondiscrimination   Advertising [facility] is a nonprofit organization and proceeds from [employer] help support our mission. [facility] does not endorse any of the third party products and services advertised. Advertising and sponsorship policy  Advertising and sponsorship opportunities Reprint Permissions A single copy of these materials may be reprinted for noncommercial personal use only. "[facility]," "[facility]," "reinvestinglink.com," "[facility]," and the triple-shield [facility] logo are trademarks of [employer] for [employer] and Research.  1610-9604 [employer] for Medical Education and Research [facility]). All rights reserved   CIRCUMCISION  Circumcision is considered an elective/non-medically necessary procedure. There are many reasons parents decide to have their sons circumsized. During the first year of life circumcised males have a reduced risk of urinary tract infections but after this year the rates between circumcised males and uncircumcised males are the same.  It is safe to have your son circumcised outside of the hospital and the places above perform them regularly.    Places to have your son circumcised:    [facility] [phone]  by 4 wks  [facility] [phone] $244 by 4 wks  [facility] [phone] $175 by 2 wks  [facility] [phone] $250 by 7 days [facility] [phone] $150 by 4 wks  These prices sometimes change but are roughly what you can expect to pay. Please call and confirm pricing.

## 2018-04-17 ENCOUNTER — Encounter: Payer: Self-pay | Admitting: Family Medicine

## 2018-04-25 ENCOUNTER — Encounter: Payer: Self-pay | Admitting: Obstetrics & Gynecology

## 2018-04-29 ENCOUNTER — Encounter: Payer: Self-pay | Admitting: Family Medicine

## 2018-04-29 ENCOUNTER — Other Ambulatory Visit (HOSPITAL_COMMUNITY)
Admission: RE | Admit: 2018-04-29 | Discharge: 2018-04-29 | Disposition: A | Discharge status: Home / Self Care | Payer: Medicaid Other | Source: Ambulatory Visit | Attending: Family Medicine | Admitting: Family Medicine

## 2018-04-29 ENCOUNTER — Ambulatory Visit (INDEPENDENT_AMBULATORY_CARE_PROVIDER_SITE_OTHER): Payer: Medicaid Other | Admitting: Family Medicine

## 2018-04-29 VITALS — BP 116/77 | HR 96 | Wt 293.0 lb

## 2018-04-29 DIAGNOSIS — Z3A37 37 weeks gestation of pregnancy: Secondary | ICD-10-CM | POA: Diagnosis not present

## 2018-04-29 DIAGNOSIS — O0990 Supervision of high risk pregnancy, unspecified, unspecified trimester: Secondary | ICD-10-CM

## 2018-04-29 DIAGNOSIS — O0993 Supervision of high risk pregnancy, unspecified, third trimester: Secondary | ICD-10-CM | POA: Insufficient documentation

## 2018-04-29 NOTE — Patient Instructions (Signed)

## 2018-04-29 NOTE — Progress Notes (Signed)
   PRENATAL VISIT NOTE  Subjective:  Regina Shaffer is a 22 y.o. G1P0000 at 1266w5d being seen today for ongoing prenatal care.  She is currently monitored for the following issues for this high-risk pregnancy and has Adjustment disorder with mixed disturbance of emotions and conduct; BMI 50.0-59.9, adult (HCC); Maternal morbid obesity, antepartum (HCC); Supervision of high risk pregnancy, antepartum; Late prenatal care; and Bartholin's gland abscess on their problem list.  Patient reports no complaints.  Contractions: Irregular. Vag. Bleeding: None.  Movement: Present. Denies leaking of fluid.   The following portions of the patient's history were reviewed and updated as appropriate: allergies, current medications, past family history, past medical history, past social history, past surgical history and problem list. Problem list updated.  Objective:   Vitals:   04/29/18 1339  BP: 116/77  Pulse: 96  Weight: 293 lb (132.9 kg)    Fetal Status: Fetal Heart Rate (bpm): 137 Fundal Height: 36 cm Movement: Present  Presentation: Vertex  General:  Alert, oriented and cooperative. Patient is in no acute distress.  Skin: Skin is warm and dry. No rash noted.   Cardiovascular: Normal heart rate noted  Respiratory: Normal respiratory effort, no problems with respiration noted  Abdomen: Soft, gravid, appropriate for gestational age.  Pain/Pressure: Present     Pelvic: Cervical exam performed Dilation: 1.5 Effacement (%): 50 Station: -3  Extremities: Normal range of motion.  Edema: Trace  Mental Status: Normal mood and affect. Normal behavior. Normal judgment and thought content.   Assessment and Plan:  Pregnancy: G1P0000 at 6566w5d  1. Supervision of high risk pregnancy, antepartum Cultures today Labor precautions reviewed - Strep Gp B NAA - GC/Chlamydia probe amp ([place]  Term labor symptoms and general obstetric precautions including but not limited to vaginal bleeding,  contractions, leaking of fluid and fetal movement were reviewed in detail with the patient. Please refer to After Visit Summary for other counseling recommendations.  Return in 1 week (on 05/06/2018).  Future Appointments  Date Time Provider Department Center  05/06/2018  1:30 PM Anyanwu, Jethro BastosUgonna A, MD [facility]    Reva Boresanya S Andris Brothers, MD

## 2018-04-30 LAB — GC/CHLAMYDIA PROBE AMP (~~LOC~~) NOT AT ARMC
CHLAMYDIA, DNA PROBE: NEGATIVE
NEISSERIA GONORRHEA: NEGATIVE

## 2018-05-01 ENCOUNTER — Encounter: Payer: Self-pay | Admitting: Family Medicine

## 2018-05-01 DIAGNOSIS — O99820 Streptococcus B carrier state complicating pregnancy: Secondary | ICD-10-CM | POA: Insufficient documentation

## 2018-05-01 LAB — STREP GP B NAA: STREP GROUP B AG: POSITIVE — AB

## 2018-05-06 ENCOUNTER — Encounter: Payer: Self-pay | Admitting: Obstetrics & Gynecology

## 2018-05-06 ENCOUNTER — Ambulatory Visit (INDEPENDENT_AMBULATORY_CARE_PROVIDER_SITE_OTHER): Payer: Medicaid Other | Admitting: Obstetrics & Gynecology

## 2018-05-06 VITALS — BP 126/82 | HR 102 | Wt 290.0 lb

## 2018-05-06 DIAGNOSIS — O99820 Streptococcus B carrier state complicating pregnancy: Secondary | ICD-10-CM

## 2018-05-06 DIAGNOSIS — O0990 Supervision of high risk pregnancy, unspecified, unspecified trimester: Secondary | ICD-10-CM

## 2018-05-06 DIAGNOSIS — O99210 Obesity complicating pregnancy, unspecified trimester: Secondary | ICD-10-CM

## 2018-05-06 NOTE — Progress Notes (Signed)
   PRENATAL VISIT NOTE  Subjective:  Regina PernaSasharay T Whiters is a 22 y.o. G1P0000 at 7052w5d being seen today for ongoing prenatal care.  She is currently monitored for the following issues for this high-risk pregnancy and has Adjustment disorder with mixed disturbance of emotions and conduct; BMI 50.0-59.9, adult (HCC); Maternal morbid obesity, antepartum (HCC); Supervision of high risk pregnancy, antepartum; Late prenatal care; Bartholin's gland abscess; and Group B Streptococcus carrier, +RV culture, currently pregnant on their problem list.  Patient reports no complaints.  Contractions: Irregular. Vag. Bleeding: None.  Movement: Present. Denies leaking of fluid.   The following portions of the patient's history were reviewed and updated as appropriate: allergies, current medications, past family history, past medical history, past social history, past surgical history and problem list. Problem list updated.  Objective:   Vitals:   05/06/18 1340  BP: 126/82  Pulse: (!) 102  Weight: 290 lb (131.5 kg)    Fetal Status: Fetal Heart Rate (bpm): 136 Fundal Height: 41 cm Movement: Present     General:  Alert, oriented and cooperative. Patient is in no acute distress.  Skin: Skin is warm and dry. No rash noted.   Cardiovascular: Normal heart rate noted  Respiratory: Normal respiratory effort, no problems with respiration noted  Abdomen: Soft, gravid, appropriate for gestational age.  Pain/Pressure: Present     Pelvic: Cervical exam deferred        Extremities: Normal range of motion.  Edema: Trace  Mental Status: Normal mood and affect. Normal behavior. Normal judgment and thought content.   Results for orders placed or performed in visit on 04/29/18 (from the past 336 hour(s))  GC/Chlamydia probe amp ([place])not at [facility]   Collection Time: 04/29/18 12:00 AM  Result Value Ref Range   Chlamydia Negative    Neisseria gonorrhea Negative   Strep Gp B NAA   Collection Time: 04/29/18  2:00 PM    Result Value Ref Range   Strep Gp B NAA Positive (A) Negative   Assessment and Plan:  Pregnancy: G1P0000 at 3052w5d  1. Maternal morbid obesity, antepartum (HCC) Growth scan ordered - US MFM OB FOLLOW UP; Future  2. Group B Streptococcus carrier, +RV culture, currently pregnant Informed of results, will get prophylaxis in labor  3. Supervision of high risk pregnancy, antepartum Term labor symptoms and general obstetric precautions including but not limited to vaginal bleeding, contractions, leaking of fluid and fetal movement were reviewed in detail with the patient. Please refer to After Visit Summary for other counseling recommendations.  Return in about 1 week (around 05/13/2018) for OB Visit.   Jaynie CollinsUgonna Lindee Leason, MD

## 2018-05-06 NOTE — Patient Instructions (Signed)
Return to clinic for any scheduled appointments or obstetric concerns, or go to MAU for evaluation  

## 2018-05-07 ENCOUNTER — Encounter (HOSPITAL_COMMUNITY): Payer: Self-pay

## 2018-05-07 ENCOUNTER — Ambulatory Visit (HOSPITAL_COMMUNITY)
Admission: RE | Admit: 2018-05-07 | Discharge: 2018-05-07 | Disposition: A | Discharge status: Home / Self Care | Payer: Medicaid Other | Source: Ambulatory Visit | Attending: Obstetrics & Gynecology | Admitting: Obstetrics & Gynecology

## 2018-05-07 DIAGNOSIS — O99820 Streptococcus B carrier state complicating pregnancy: Secondary | ICD-10-CM

## 2018-05-07 DIAGNOSIS — O99210 Obesity complicating pregnancy, unspecified trimester: Secondary | ICD-10-CM

## 2018-05-07 DIAGNOSIS — Z3A38 38 weeks gestation of pregnancy: Secondary | ICD-10-CM | POA: Insufficient documentation

## 2018-05-07 DIAGNOSIS — N751 Abscess of Bartholin's gland: Secondary | ICD-10-CM

## 2018-05-07 DIAGNOSIS — Z362 Encounter for other antenatal screening follow-up: Secondary | ICD-10-CM | POA: Insufficient documentation

## 2018-05-07 DIAGNOSIS — O99213 Obesity complicating pregnancy, third trimester: Secondary | ICD-10-CM | POA: Insufficient documentation

## 2018-05-14 ENCOUNTER — Encounter (HOSPITAL_COMMUNITY): Payer: Self-pay | Admitting: *Deleted

## 2018-05-14 ENCOUNTER — Telehealth (HOSPITAL_COMMUNITY): Payer: Self-pay | Admitting: *Deleted

## 2018-05-14 ENCOUNTER — Ambulatory Visit (INDEPENDENT_AMBULATORY_CARE_PROVIDER_SITE_OTHER): Payer: Medicaid Other | Admitting: Student

## 2018-05-14 VITALS — BP 131/84 | HR 72 | Wt 294.4 lb

## 2018-05-14 DIAGNOSIS — O0993 Supervision of high risk pregnancy, unspecified, third trimester: Secondary | ICD-10-CM

## 2018-05-14 DIAGNOSIS — O99820 Streptococcus B carrier state complicating pregnancy: Secondary | ICD-10-CM

## 2018-05-14 DIAGNOSIS — O0990 Supervision of high risk pregnancy, unspecified, unspecified trimester: Secondary | ICD-10-CM

## 2018-05-14 NOTE — Telephone Encounter (Signed)
Preadmission screen  

## 2018-05-14 NOTE — Patient Instructions (Addendum)
AREA PEDIATRIC/FAMILY PRACTICE PHYSICIANS  [facility] [address], [address] [place], [place]  [address] Phone - [phone]   Fax - [phone]  [facility] [address] [place], [place] [address] Phone - [phone]   Fax - [phone]  [facility] [address] [place], [place]  [address] Phone - [phone]   Fax - [phone]  [facility] [address] [place], [place]  [address] Phone - [phone]   Fax - [phone]  [facility] [address] [place], [place]  [address] Phone - [phone]   Fax - [phone]  [facility] [address] [place], [place]  [address] Phone - [phone]   Fax - [phone]  [facility] [address] [place], [place]  [address] Phone - [phone]   Fax - [phone]  [facility] [address], [address] [place], [place]  [address] Phone - [phone]   Fax - [phone]  [facility] [address] [place], [place]  [address] Phone - [phone]   Fax - [phone] [facility] [address] [place], [place]  [address] Phone - [phone]   Fax - [phone]  [facility] [address] [place], [place]  [address] Phone - [phone]   Fax - [phone]  [facility] [address] [place], [place]  [address] Phone - [phone]   Fax - [phone]  [facility] [address], [address] [place], [place]  [address] Phone - [phone]   Fax - [phone]  [facility] [address] [place], [place]  [address] Phone - [phone]  [facility] [address] [place], [place]  [address] Phone - [phone]   Fax - [phone]  [facility] [address] [place], [place]  [address] Phone - [phone]   Fax - [phone]  [facility] [address] [place], [place]  [address] Phone - [phone]   Fax - [phone]  [facility] [address] [place], [place]  [address] Phone - [phone]   Fax - [phone]   [facility] [address] [place], [place]  [address] Phone - [phone]   Fax - [phone]  [facility] [address] [place], [place]  [address] Phone - [phone]   Fax - [phone]  [facility] [address] [place], [place]  [address] Phone - [phone]   Fax - [phone]  [facility] [address] [place], [place]  [address] Phone - [phone]   Fax - [phone]  [facility] [address] [place], [place]  [address] Phone - [phone]   Fax - [phone] [facility] [address] [place], [place]  [address] Phone - [phone]   Fax - [phone]  [facility] [address] [place],   [address] Phone - [phone]   Fax - [phone]  [facility] [address] [place],   [address] Phone - [phone]   Fax - [phone]  [facility] Charlene Flemming MD [address] [place]  [address] Phone [phone]  Fax [phone]   Levonorgestrel intrauterine device (IUD) What is this medicine? LEVONORGESTREL IUD (LEE voe nor jes   trel) is a contraceptive (birth control) device. The device is placed inside the uterus by a healthcare professional. It is used to prevent pregnancy. This device can also be used to treat heavy bleeding that occurs during your period. This medicine may be used for other purposes; ask your health care provider or pharmacist if you have questions. COMMON BRAND NAME(S): Kyleena, LILETTA,  Mirena, Skyla What should I tell my health care provider before I take this medicine? They need to know if you have any of these conditions: -abnormal Pap smear -cancer of the breast, uterus, or cervix -diabetes -endometritis -genital or pelvic infection now or in the past -have more than one sexual partner or your partner has more than one partner -heart disease -history of an ectopic or tubal pregnancy -immune system problems -IUD in place -liver disease or tumor -problems with blood clots or take blood-thinners -seizures -use intravenous drugs -uterus of unusual shape -vaginal bleeding that has not been explained -an unusual or allergic reaction to levonorgestrel, other hormones, silicone, or polyethylene, medicines, foods, dyes, or preservatives -pregnant or trying to get pregnant -breast-feeding How should I use this medicine? This device is placed inside the uterus by a health care professional. Talk to your pediatrician regarding the use of this medicine in children. Special care may be needed. Overdosage: If you think you have taken too much of this medicine contact a poison control center or emergency room at once. NOTE: This medicine is only for you. Do not share this medicine with others. What if I miss a dose? This does not apply. Depending on the brand of device you have inserted, the device will need to be replaced every 3 to 5 years if you wish to continue using this type of birth control. What may interact with this medicine? Do not take this medicine with any of the following medications: -amprenavir -bosentan -fosamprenavir This medicine may also interact with the following medications: -aprepitant -armodafinil -barbiturate medicines for inducing sleep or treating seizures -bexarotene -boceprevir -griseofulvin -medicines to treat seizures like carbamazepine, ethotoin, felbamate, oxcarbazepine, phenytoin,  topiramate -modafinil -pioglitazone -rifabutin -rifampin -rifapentine -some medicines to treat HIV infection like atazanavir, efavirenz, indinavir, lopinavir, nelfinavir, tipranavir, ritonavir -St. John's wort -warfarin This list may not describe all possible interactions. Give your health care provider a list of all the medicines, herbs, non-prescription drugs, or dietary supplements you use. Also tell them if you smoke, drink alcohol, or use illegal drugs. Some items may interact with your medicine. What should I watch for while using this medicine? Visit your doctor or health care professional for regular check ups. See your doctor if you or your partner has sexual contact with others, becomes HIV positive, or gets a sexual transmitted disease. This product does not protect you against HIV infection (AIDS) or other sexually transmitted diseases. You can check the placement of the IUD yourself by reaching up to the top of your vagina with clean fingers to feel the threads. Do not pull on the threads. It is a good habit to check placement after each menstrual period. Call your doctor right away if you feel more of the IUD than just the threads or if you cannot feel the threads at all. The IUD may come out by itself. You may become pregnant if the device comes out. If you notice that the IUD has come out use a backup birth control method like condoms and call your health care provider. Using tampons will not change the position of the IUD   and are okay to use during your period. This IUD can be safely scanned with magnetic resonance imaging (MRI) only under specific conditions. Before you have an MRI, tell your healthcare provider that you have an IUD in place, and which type of IUD you have in place. What side effects may I notice from receiving this medicine? Side effects that you should report to your doctor or health care professional as soon as possible: -allergic reactions like skin rash,  itching or hives, swelling of the face, lips, or tongue -fever, flu-like symptoms -genital sores -high blood pressure -no menstrual period for 6 weeks during use -pain, swelling, warmth in the leg -pelvic pain or tenderness -severe or sudden headache -signs of pregnancy -stomach cramping -sudden shortness of breath -trouble with balance, talking, or walking -unusual vaginal bleeding, discharge -yellowing of the eyes or skin Side effects that usually do not require medical attention (report to your doctor or health care professional if they continue or are bothersome): -acne -breast pain -change in sex drive or performance -changes in weight -cramping, dizziness, or faintness while the device is being inserted -headache -irregular menstrual bleeding within first 3 to 6 months of use -nausea This list may not describe all possible side effects. Call your doctor for medical advice about side effects. You may report side effects to FDA at 1-800-FDA-1088. Where should I keep my medicine? This does not apply. NOTE: This sheet is a summary. It may not cover all possible information. If you have questions about this medicine, talk to your doctor, pharmacist, or health care provider.  2018 Elsevier/Gold Standard (2016-08-24 14:14:56)   

## 2018-05-14 NOTE — Progress Notes (Signed)
   PRENATAL VISIT NOTE  Subjective:  Regina Shaffer is a 22 y.o. G1P0000 at 3066w6d being seen today for ongoing prenatal care.  She is currently monitored for the following issues for this low-risk pregnancy and has Adjustment disorder with mixed disturbance of emotions and conduct; BMI 50.0-59.9, adult (HCC); Maternal morbid obesity, antepartum (HCC); Supervision of high risk pregnancy, antepartum; Late prenatal care; Bartholin's gland abscess; and Group B Streptococcus carrier, +RV culture, currently pregnant on their problem list.  Patient reports no complaints.  Contractions: Irregular.  .  Movement: Present. Denies leaking of fluid.   The following portions of the patient's history were reviewed and updated as appropriate: allergies, current medications, past family history, past medical history, past social history, past surgical history and problem list. Problem list updated.  Objective:   Vitals:   05/14/18 1341  BP: 131/84  Pulse: 72  Weight: 294 lb 6.4 oz (133.5 kg)    Fetal Status: Fetal Heart Rate (bpm): 141 Fundal Height: 39 cm Movement: Present     General:  Alert, oriented and cooperative. Patient is in no acute distress.  Skin: Skin is warm and dry. No rash noted.   Cardiovascular: Normal heart rate noted  Respiratory: Normal respiratory effort, no problems with respiration noted  Abdomen: Soft, gravid, appropriate for gestational age.  Pain/Pressure: Present     Pelvic: Cervical exam performed        Extremities: Normal range of motion.     Mental Status: Normal mood and affect. Normal behavior. Normal judgment and thought content.   Assessment and Plan:  Pregnancy: G1P0000 at 1466w6d  1. Supervision of high risk pregnancy, antepartum -Patient doing well; declined Foley Bulb placement after careful consideration.  -Explained to her the process of induction (FB and cytotec, pitcoin) but reassured her that we would not intervene more than necessary if her body labors  on its own.   2. Group B Streptococcus carrier, +RV culture, currently pregnant -plan for treatment in labor   Term labor symptoms and general obstetric precautions including but not limited to vaginal bleeding, contractions, leaking of fluid and fetal movement were reviewed in detail with the patient. Please refer to After Visit Summary for other counseling recommendations.  Return keep appt on 6-27 for induction.  Future Appointments  Date Time Provider Department Center  05/22/2018  7:30 AM [facility] ROOM [facility] None    Charlesetta GaribaldiKathryn Lorraine [place] Wells[place], [place]

## 2018-05-20 ENCOUNTER — Inpatient Hospital Stay (HOSPITAL_COMMUNITY)
Admission: AD | Admit: 2018-05-20 | Discharge: 2018-05-20 | Disposition: A | Discharge status: Home / Self Care | Payer: Medicaid Other | Source: Ambulatory Visit | Attending: Obstetrics and Gynecology | Admitting: Obstetrics and Gynecology

## 2018-05-20 ENCOUNTER — Encounter (HOSPITAL_COMMUNITY): Payer: Self-pay

## 2018-05-20 DIAGNOSIS — Z87891 Personal history of nicotine dependence: Secondary | ICD-10-CM | POA: Diagnosis not present

## 2018-05-20 DIAGNOSIS — O26893 Other specified pregnancy related conditions, third trimester: Secondary | ICD-10-CM | POA: Insufficient documentation

## 2018-05-20 DIAGNOSIS — N898 Other specified noninflammatory disorders of vagina: Secondary | ICD-10-CM | POA: Insufficient documentation

## 2018-05-20 DIAGNOSIS — Z3A40 40 weeks gestation of pregnancy: Secondary | ICD-10-CM | POA: Insufficient documentation

## 2018-05-20 DIAGNOSIS — X58XXXA Exposure to other specified factors, initial encounter: Secondary | ICD-10-CM | POA: Insufficient documentation

## 2018-05-20 DIAGNOSIS — T148XXA Other injury of unspecified body region, initial encounter: Secondary | ICD-10-CM | POA: Insufficient documentation

## 2018-05-20 DIAGNOSIS — Z3689 Encounter for other specified antenatal screening: Secondary | ICD-10-CM | POA: Diagnosis present

## 2018-05-20 LAB — POCT FERN TEST: POCT FERN TEST: NEGATIVE

## 2018-05-20 LAB — AMNISURE RUPTURE OF MEMBRANE (ROM) NOT AT ARMC: AMNISURE: NEGATIVE

## 2018-05-20 MED ORDER — NIFEDIPINE 10 MG PO CAPS: 10.0000 mg | ORAL_CAPSULE | ORAL | Status: DC

## 2018-05-20 NOTE — Discharge Instructions (Signed)

## 2018-05-20 NOTE — MAU Provider Note (Signed)
History   [id number]   Chief Complaint  Patient presents with  . Rupture of Membranes    HPI Regina Shaffer is a 22 y.o. female  G1P0000 @40 .5 wks here with report of her underwear were wet around 1430 yesterday.  Leaking of fluid has not continued. Pt reports rare contractions. She denies vaginal bleeding. Last intercourse was not recent. She reports good fetal movement. Also reports "bump" at her right groin, first noticed it yesterday. All other systems negative.    Patient's last menstrual period was 07/22/2017 (lmp unknown).  OB History  Gravida Para Term Preterm AB Living  1 0 0 0 0 0  SAB TAB Ectopic Multiple Live Births  0 0 0 0 0    # Outcome Date GA Lbr Len/2nd Weight Sex Delivery Anes PTL Lv  1 Current             Past Medical History:  Diagnosis Date  . Chlamydia   . Gastric ulcer   . GERD (gastroesophageal reflux disease)   . Hypotension     Family History  Problem Relation Age of Onset  . Hypertension Maternal Grandmother   . Diabetes Maternal Grandmother     Social History   Socioeconomic History  . Marital status: Single    Spouse name: Not on file  . Number of children: Not on file  . Years of education: Not on file  . Highest education level: Not on file  Occupational History  . Not on file  Social Needs  . Financial resource strain: Not on file  . Food insecurity:    Worry: Not on file    Inability: Not on file  . Transportation needs:    Medical: Not on file    Non-medical: Not on file  Tobacco Use  . Smoking status: Former Smoker    Packs/day: 1.00    Types: Cigarettes  . Smokeless tobacco: Never Used  Substance and Sexual Activity  . Alcohol use: No  . Drug use: No  . Sexual activity: Not Currently    Partners: Male    Birth control/protection: None  Lifestyle  . Physical activity:    Days per week: Not on file    Minutes per session: Not on file  . Stress: Not on file  Relationships  . Social connections:    Talks  on phone: Not on file    Gets together: Not on file    Attends religious service: Not on file    Active member of club or organization: Not on file    Attends meetings of clubs or organizations: Not on file    Relationship status: Not on file  Other Topics Concern  . Not on file  Social History Narrative  . Not on file    No Known Allergies  No current facility-administered medications on file prior to encounter.    Current Outpatient Medications on File Prior to Encounter  Medication Sig Dispense Refill  . Prenatal Vit-Fe Fumarate-FA (PRENATAL VITAMIN PO) Take by mouth.       Review of Systems  Gastrointestinal: Negative for abdominal pain.  Genitourinary: Positive for vaginal discharge.  Skin: Positive for wound.     Physical Exam   Vitals:   05/20/18 0426  BP: 124/64  Pulse: 100  Resp: 19  Temp: 97.8 F (36.6 C)  TempSrc: Oral  Weight: (!) 301 lb (136.5 kg)  Height: 5' (1.524 m)    Physical Exam  Constitutional: She is oriented to person,  place, and time. She appears well-developed and well-nourished. No distress.  HENT:  Head: Normocephalic and atraumatic.  Neck: Normal range of motion.  Respiratory: Effort normal. No respiratory distress.  Genitourinary:  Genitourinary Comments: SSE: +creamy discharge, no pool, fern equivocal SVE: 1.5/60/-2, vtx  Musculoskeletal: Normal range of motion.  Neurological: She is alert and oriented to person, place, and time.  Skin: Skin is warm and dry.     Psychiatric: She has a normal mood and affect.  EFM: 135 bpm, mod variability, + accels, no decels Toco: rare  Results for orders placed or performed during the hospital encounter of 05/20/18 (from the past 24 hour(s))  Fern Test     Status: Normal   Collection Time: 05/20/18  5:20 AM  Result Value Ref Range   POCT Fern Test Negative = intact amniotic membranes   Amnisure rupture of membrane (rom)not at [facility]     Status: None   Collection Time: 05/20/18  5:39 AM    Result Value Ref Range   Amnisure ROM NEGATIVE    MAU Course  Procedures  MDM No evidence of SROM or labor. Skin break down likely d/t location and obesity, will treat with Neosporin. Stable for discharge home.   Assessment and Plan   1. [redacted] weeks gestation of pregnancy   2. NST (non-stress test) reactive   3. Vaginal discharge during pregnancy in third trimester   4. Skin abrasion    Discharge home Follow up in 2 days for IOL Labor precautions FMCs Neosporin BID  Allergies as of 05/20/2018   No Known Allergies     Medication List    TAKE these medications   PRENATAL VITAMIN PO Take by mouth.      Donette Larry, CNM 05/20/2018 6:05 AM

## 2018-05-22 ENCOUNTER — Inpatient Hospital Stay (HOSPITAL_COMMUNITY): Payer: Medicaid Other | Admitting: Anesthesiology

## 2018-05-22 ENCOUNTER — Inpatient Hospital Stay (HOSPITAL_COMMUNITY)
Admission: RE | Admit: 2018-05-22 | Discharge: 2018-05-25 | DRG: 806 | Disposition: A | Discharge status: Home / Self Care | Payer: Medicaid Other | Attending: Family Medicine | Admitting: Family Medicine

## 2018-05-22 ENCOUNTER — Encounter (HOSPITAL_COMMUNITY): Payer: Self-pay

## 2018-05-22 DIAGNOSIS — Z87891 Personal history of nicotine dependence: Secondary | ICD-10-CM

## 2018-05-22 DIAGNOSIS — O99214 Obesity complicating childbirth: Secondary | ICD-10-CM | POA: Diagnosis present

## 2018-05-22 DIAGNOSIS — Z3A41 41 weeks gestation of pregnancy: Secondary | ICD-10-CM | POA: Diagnosis not present

## 2018-05-22 DIAGNOSIS — O99824 Streptococcus B carrier state complicating childbirth: Secondary | ICD-10-CM | POA: Diagnosis present

## 2018-05-22 DIAGNOSIS — O480 Post-term pregnancy: Principal | ICD-10-CM | POA: Diagnosis present

## 2018-05-22 DIAGNOSIS — Z8759 Personal history of other complications of pregnancy, childbirth and the puerperium: Secondary | ICD-10-CM

## 2018-05-22 DIAGNOSIS — E669 Obesity, unspecified: Secondary | ICD-10-CM | POA: Diagnosis present

## 2018-05-22 LAB — TYPE AND SCREEN
ABO/RH(D): B POS
Antibody Screen: NEGATIVE

## 2018-05-22 LAB — CBC
HEMATOCRIT: 32.1 % — AB (ref 36.0–46.0)
Hemoglobin: 10.6 g/dL — ABNORMAL LOW (ref 12.0–15.0)
MCH: 24.7 pg — ABNORMAL LOW (ref 26.0–34.0)
MCHC: 33 g/dL (ref 30.0–36.0)
MCV: 74.8 fL — ABNORMAL LOW (ref 78.0–100.0)
Platelets: 265 10*3/uL (ref 150–400)
RBC: 4.29 MIL/uL (ref 3.87–5.11)
RDW: 15.2 % (ref 11.5–15.5)
WBC: 8.8 10*3/uL (ref 4.0–10.5)

## 2018-05-22 LAB — RPR: RPR Ser Ql: NONREACTIVE

## 2018-05-22 MED ORDER — OXYCODONE-ACETAMINOPHEN 5-325 MG PO TABS: 2.0000 | ORAL_TABLET | ORAL | Status: DC | PRN

## 2018-05-22 MED ORDER — DIPHENHYDRAMINE HCL 50 MG/ML IJ SOLN
12.5000 mg | INTRAMUSCULAR | Status: AC | PRN
  Administered 2018-05-23 (×3): 12.5 mg via INTRAVENOUS
  Filled 2018-05-22: qty 1

## 2018-05-22 MED ORDER — EPHEDRINE 5 MG/ML INJ
10.0000 mg | INTRAVENOUS | Status: DC | PRN
  Filled 2018-05-22: qty 2

## 2018-05-22 MED ORDER — PHENYLEPHRINE 40 MCG/ML (10ML) SYRINGE FOR IV PUSH (FOR BLOOD PRESSURE SUPPORT)
80.0000 ug | PREFILLED_SYRINGE | INTRAVENOUS | Status: DC | PRN
  Filled 2018-05-22: qty 5

## 2018-05-22 MED ORDER — LIDOCAINE HCL (PF) 1 % IJ SOLN
INTRAMUSCULAR | Status: DC | PRN
  Administered 2018-05-22 (×2): 5 mL via EPIDURAL

## 2018-05-22 MED ORDER — LIDOCAINE HCL (PF) 1 % IJ SOLN
30.0000 mL | INTRAMUSCULAR | Status: DC | PRN
  Administered 2018-05-23: 30 mL via SUBCUTANEOUS
  Filled 2018-05-22: qty 30

## 2018-05-22 MED ORDER — OXYTOCIN 40 UNITS IN LACTATED RINGERS INFUSION - SIMPLE MED
1.0000 m[IU]/min | INTRAVENOUS | Status: DC
  Administered 2018-05-22: 2 m[IU]/min via INTRAVENOUS

## 2018-05-22 MED ORDER — SOD CITRATE-CITRIC ACID 500-334 MG/5ML PO SOLN: 30.0000 mL | ORAL | Status: DC | PRN

## 2018-05-22 MED ORDER — TERBUTALINE SULFATE 1 MG/ML IJ SOLN
0.2500 mg | Freq: Once | INTRAMUSCULAR | Status: AC | PRN
  Administered 2018-05-23: 0.25 mg via SUBCUTANEOUS
  Filled 2018-05-22: qty 1

## 2018-05-22 MED ORDER — MISOPROSTOL 50MCG HALF TABLET
50.0000 ug | ORAL_TABLET | ORAL | Status: DC
  Administered 2018-05-22: 50 ug via BUCCAL
  Filled 2018-05-22: qty 1

## 2018-05-22 MED ORDER — MISOPROSTOL 25 MCG QUARTER TABLET
25.0000 ug | ORAL_TABLET | ORAL | Status: DC
  Administered 2018-05-22 (×2): 25 ug via VAGINAL
  Filled 2018-05-22 (×2): qty 1

## 2018-05-22 MED ORDER — FENTANYL CITRATE (PF) 100 MCG/2ML IJ SOLN
100.0000 ug | INTRAMUSCULAR | Status: DC | PRN
  Filled 2018-05-22: qty 2

## 2018-05-22 MED ORDER — PENICILLIN G POT IN DEXTROSE 60000 UNIT/ML IV SOLN
3.0000 10*6.[IU] | INTRAVENOUS | Status: DC
  Administered 2018-05-22 – 2018-05-23 (×8): 3 10*6.[IU] via INTRAVENOUS
  Filled 2018-05-22 (×13): qty 50

## 2018-05-22 MED ORDER — SODIUM CHLORIDE 0.9 % IV SOLN
5.0000 10*6.[IU] | Freq: Once | INTRAVENOUS | Status: AC
  Administered 2018-05-22: 5 10*6.[IU] via INTRAVENOUS
  Filled 2018-05-22: qty 5

## 2018-05-22 MED ORDER — LACTATED RINGERS IV SOLN: 500.0000 mL | Freq: Once | INTRAVENOUS | Status: DC

## 2018-05-22 MED ORDER — OXYTOCIN BOLUS FROM INFUSION
500.0000 mL | Freq: Once | INTRAVENOUS | Status: AC
  Administered 2018-05-23: 500 mL via INTRAVENOUS

## 2018-05-22 MED ORDER — LACTATED RINGERS IV SOLN
INTRAVENOUS | Status: DC
  Administered 2018-05-22 – 2018-05-23 (×6): via INTRAVENOUS

## 2018-05-22 MED ORDER — OXYCODONE-ACETAMINOPHEN 5-325 MG PO TABS: 1.0000 | ORAL_TABLET | ORAL | Status: DC | PRN

## 2018-05-22 MED ORDER — PHENYLEPHRINE 40 MCG/ML (10ML) SYRINGE FOR IV PUSH (FOR BLOOD PRESSURE SUPPORT)
80.0000 ug | PREFILLED_SYRINGE | INTRAVENOUS | Status: DC | PRN
  Filled 2018-05-22: qty 5
  Filled 2018-05-22: qty 10

## 2018-05-22 MED ORDER — OXYTOCIN 40 UNITS IN LACTATED RINGERS INFUSION - SIMPLE MED
2.5000 [IU]/h | INTRAVENOUS | Status: DC
  Filled 2018-05-22: qty 1000

## 2018-05-22 MED ORDER — FENTANYL 2.5 MCG/ML BUPIVACAINE 1/10 % EPIDURAL INFUSION (WH - ANES)
14.0000 mL/h | INTRAMUSCULAR | Status: DC | PRN
  Administered 2018-05-22 – 2018-05-23 (×3): 14 mL/h via EPIDURAL
  Filled 2018-05-22 (×3): qty 100

## 2018-05-22 MED ORDER — FLEET ENEMA 7-19 GM/118ML RE ENEM: 1.0000 | ENEMA | RECTAL | Status: DC | PRN

## 2018-05-22 MED ORDER — LACTATED RINGERS IV SOLN
500.0000 mL | INTRAVENOUS | Status: DC | PRN
  Administered 2018-05-23: 500 mL via INTRAVENOUS

## 2018-05-22 MED ORDER — ONDANSETRON HCL 4 MG/2ML IJ SOLN: 4.0000 mg | Freq: Four times a day (QID) | INTRAMUSCULAR | Status: DC | PRN

## 2018-05-22 MED ORDER — ACETAMINOPHEN 325 MG PO TABS: 650.0000 mg | ORAL_TABLET | ORAL | Status: DC | PRN

## 2018-05-22 NOTE — H&P (Signed)
HPI: Regina Shaffer is a 22 y.o. year old 521P0000 female at 3823w0d weeks gestation who presents to L&D for IOL for Postdates. Denies LOF. VB.   Clinic   Prenatal Labs  Dating  edc 6/20, 16wk u/s Blood type: B/Positive/-- (01/15 1450) B pos  Genetic Screen Quad: neg Antibody:Negative (01/15 1450)neg  Anatomic US  [x ] f/u completion anatomy u/s; normal on 05-07-2018 Rubella: 1.19 (01/15 1450)imm  GTT Early:  Neg 1hr             Third trimester: 89 103 97 RPR: Non Reactive (01/15 1450) neg  Flu vaccine Declined HBsAg: Negative (01/15 1450) neg  TDaP vaccine               Declined 03/12/2018                               Rhogam: n/a HIV: neg   Baby Food  breast GBS: Positive  Contraception IUD Pap: neg 2018  Circumcision Yes   Pediatrician   List given on 05-14-2018  CF: neg  Support Person Pt's Mother SMA: low risk  Prenatal Classes Thinking about enrolling Hgb electrophoresis: neg    OB History    Gravida  1   Para  0   Term  0   Preterm  0   AB  0   Living  0     SAB  0   TAB  0   Ectopic  0   Multiple  0   Live Births  0          Past Medical History:  Diagnosis Date  . Chlamydia   . Gastric ulcer   . GERD (gastroesophageal reflux disease)   . Hypotension    Past Surgical History:  Procedure Laterality Date  . MYRINGOTOMY     Family History: family history includes Diabetes in her maternal grandmother; Hypertension in her maternal grandmother. Social History:  reports that she has quit smoking. Her smoking use included cigarettes. She smoked 1.00 pack per day. She has never used smokeless tobacco. She reports that she does not drink alcohol or use drugs.     Maternal Diabetes: No Genetic Screening: Normal Maternal Ultrasounds/Referrals: Normal Fetal Ultrasounds or other Referrals:  None Maternal Substance Abuse:  No Significant Maternal Medications:  None Significant Maternal Lab Results:  Lab values include: Group B Strep positive Other Comments:   None  Review of Systems  Constitutional: Negative for chills and fever.  Eyes: Negative for double vision.  Gastrointestinal: Negative for abdominal pain, nausea and vomiting.  Neurological: Negative for headaches.   Maternal Medical History:  Reason for admission: Nausea.      Last menstrual period 07/22/2017. Maternal Exam:  Uterine Assessment: Contraction strength is mild.  Contraction frequency is rare.   Abdomen: Patient reports no abdominal tenderness. Estimated fetal weight is 7-9 on 6-12.   Fetal presentation: vertex  Introitus: Normal vulva. Normal vagina.  Vagina is negative for discharge.  Pelvis: adequate for delivery.   Cervix: Cervix evaluated by digital exam.     Fetal Exam Fetal Monitor Review: Mode: ultrasound.   Baseline rate: 140.  Variability: moderate (6-25 bpm).   Pattern: accelerations present and no decelerations.    Fetal State Assessment: Category I - tracings are normal.     Physical Exam  Nursing note and vitals reviewed. Constitutional: She is oriented to person, place, and time. She appears well-developed and well-nourished.  No distress.  Cardiovascular: Normal rate and regular rhythm.  Respiratory: Effort normal. No respiratory distress.  GI: Soft. She exhibits no distension. There is no tenderness.  Genitourinary: Vagina normal. No vaginal discharge found.  Musculoskeletal: She exhibits no edema.  Neurological: She is alert and oriented to person, place, and time. She has normal reflexes.  Skin: Skin is warm and dry.  Psychiatric: She has a normal mood and affect.    Prenatal labs: ABO, Rh: B/Positive/-- (01/15 1450) Antibody: Negative (01/15 1450) Rubella: 1.19 (01/15 1450) RPR: Non Reactive (04/17 0920)  HBsAg: Negative (01/15 1450)  HIV: Non Reactive (04/17 0920)  GBS: Positive (06/04 1400)   Assessment: 1. Labor: IOL for PD 2. Fetal Wellbeing: Category I  3. Pain Control: None 4. GBS: Pos 5. 41.0 week IUP  Plan:   1. Admit to BS per consult with MD 2. Routine L&D orders 3. Analgesia/anesthesia PRN  4. PCN for GBS 5. Cytotec  [place] 05/22/2018, 8:57 AM

## 2018-05-22 NOTE — Progress Notes (Signed)
Patient ID: Regina PernaSasharay T Shaffer, female   DOB: 1996-06-24, 24 y.o.   MRN: [id number] Regina PernaSasharay T Shaffer is a 22 y.o. G1P0000 at 5727w0d.  Subjective: Cramping  Objective: BP 103/82 (BP Location: Left Arm)   Pulse 81   Temp 98.4 F (36.9 C) (Oral)   Resp 19   LMP 07/22/2017 (LMP Unknown)    FHT:  FHR: 145 bpm, variability: mod,  accelerations:  15c15,  decelerations:  none UC:   Q 1-3 minutes, mild Dilation: 2 Effacement (%): 60 Cervical Position: Middle Station: -3 Presentation: Vertex Exam by:: [place], rn  Labs: NA  Assessment / Plan: 1627w0d week IUP Labor: Early/IOL, no progress.  Fetal Wellbeing:  Category I Pain Control:  None Anticipated MOD:  SVD Switch to Cytotec 50 mcg Buccal. Considered Foley, but pt is very intolerant of exams.   Katrinka BlazingSmith, [place], [place] 05/22/2018 7:48 PM

## 2018-05-22 NOTE — Anesthesia Preprocedure Evaluation (Signed)
Anesthesia Evaluation  Patient identified by MRN, date of birth, ID band Patient awake    Reviewed: Allergy & Precautions, H&P , NPO status , Patient's Chart, lab work & pertinent test results  History of Anesthesia Complications Negative for: history of anesthetic complications  Airway Mallampati: II  TM Distance: >3 FB Neck ROM: full    Dental no notable dental hx. (+) Teeth Intact   Pulmonary neg pulmonary ROS, former smoker,    Pulmonary exam normal breath sounds clear to auscultation       Cardiovascular negative cardio ROS Normal cardiovascular exam Rhythm:regular Rate:Normal     Neuro/Psych negative neurological ROS  negative psych ROS   GI/Hepatic negative GI ROS, Neg liver ROS,   Endo/Other  negative endocrine ROS  Renal/GU negative Renal ROS  negative genitourinary   Musculoskeletal negative musculoskeletal ROS (+)   Abdominal   Peds negative pediatric ROS (+)  Hematology negative hematology ROS (+)   Anesthesia Other Findings   Reproductive/Obstetrics (+) Pregnancy                             Anesthesia Physical Anesthesia Plan  ASA: III  Anesthesia Plan: Epidural   Post-op Pain Management:    Induction:   PONV Risk Score and Plan:   Airway Management Planned:   Additional Equipment:   Intra-op Plan:   Post-operative Plan:   Informed Consent: I have reviewed the patients History and Physical, chart, labs and discussed the procedure including the risks, benefits and alternatives for the proposed anesthesia with the patient or authorized representative who has indicated his/her understanding and acceptance.     Plan Discussed with:   Anesthesia Plan Comments:         Anesthesia Quick Evaluation

## 2018-05-22 NOTE — Anesthesia Procedure Notes (Signed)
Epidural Patient location during procedure: OB  Staffing Anesthesiologist: Adalai Perl, MD Performed: anesthesiologist   Preanesthetic Checklist Completed: patient identified, site marked, surgical consent, pre-op evaluation, timeout performed, IV checked, risks and benefits discussed and monitors and equipment checked  Epidural Patient position: sitting Prep: DuraPrep Patient monitoring: heart rate, continuous pulse ox and blood pressure Approach: midline Location: L3-L4 Injection technique: LOR saline  Needle:  Needle type: Tuohy  Needle gauge: 17 G Needle length: 9 cm and 9 Needle insertion depth: 10 cm Catheter type: closed end flexible Catheter size: 20 Guage Catheter at skin depth: 15 cm Test dose: negative  Assessment Events: blood not aspirated, injection not painful, no injection resistance, negative IV test and no paresthesia  Additional Notes Patient identified. Risks/Benefits/Options discussed with patient including but not limited to bleeding, infection, nerve damage, paralysis, failed block, incomplete pain control, headache, blood pressure changes, nausea, vomiting, reactions to medication both or allergic, itching and postpartum back pain. Confirmed with bedside nurse the patient's most recent platelet count. Confirmed with patient that they are not currently taking any anticoagulation, have any bleeding history or any family history of bleeding disorders. Patient expressed understanding and wished to proceed. All questions were answered. Sterile technique was used throughout the entire procedure. Please see nursing notes for vital signs. Test dose was given through epidural needle and negative prior to continuing to dose epidural or start infusion. Warning signs of high block given to the patient including shortness of breath, tingling/numbness in hands, complete motor block, or any concerning symptoms with instructions to call for help. Patient was given  instructions on fall risk and not to get out of bed. All questions and concerns addressed with instructions to call with any issues.     

## 2018-05-22 NOTE — Anesthesia Pain Management Evaluation Note (Signed)
  CRNA Pain Management Visit Note  Patient: Regina Shaffer, 22 y.o., female  "Hello [facility] am a [facility]. We have an anesthesia team available at all times to provide care throughout the hospital, including epidural management and anesthesia for C-section. I don't know your plan for the delivery whether it a natural birth, water birth, IV sedation, nitrous supplementation, doula or epidural, but we want to meet your pain goals."   1.Was your pain managed to your expectations on prior hospitalizations?   No prior hospitalizations  2.What is your expectation for pain management during this hospitalization?     Labor support without medications  3.How can we help you reach that goal? unsure  Record the patient's initial score and the patient's pain goal.   Pain: 6  Pain Goal: 8 [facility] wants you to be able to say your pain was always managed very well.  Cephus ShellingBURGER,Regina Shaffer 05/22/2018

## 2018-05-22 NOTE — Progress Notes (Signed)
Patient ID: Regina Shaffer, female   DOB: 11-Mar-1996, 22 y.o.   MRN: [id number] Regina Shaffer is a 22 y.o. G1P0000 at 2359w0d.  Subjective: Mild cramping  Objective: BP 115/61   Pulse 86   Resp 16   LMP 07/22/2017 (LMP Unknown)    FHT:  FHR: 150 bpm, variability: mod,  accelerations:  15x15,  decelerations: Uncertain. Abrupt drops in FHR to 50-60's w/ abrupt return to reactive tracing. Doubt decels. More C/W Fetal arrhythmia.   UC: Irreg, mild Dilation: 2 Effacement (%): 50 Cervical Position: Middle Station: -3 Presentation: Vertex Exam by:: Omnia Dollinger, cnm Attempted SROM and IFSE. Pt unable to tolerate exam/AROM  Labs: NA  Assessment / Plan: 4359w0d week IUP Labor: Early/IOL: Continue cytotec Fetal Wellbeing:  Difficult to assess, but predominantly reactive  Pain Control:  Comfort measures Anticipated MOD:  SVD Possible fetal arrhythmia. Will notify attending and Peds  Katrinka BlazingSmith, [place], [place] 05/22/2018 3:24 PM

## 2018-05-22 NOTE — Progress Notes (Addendum)
LABOR PROGRESS NOTE  Regina Shaffer is a 22 y.o. G1P0000 at 6479w0d  admitted for IOL for postdates  Subjective: Pt starting to get uncomfortable, starting to feel some contractions  Objective: BP (!) 111/52   Pulse 94   Temp 98.4 F (36.9 C) (Oral)   Resp 19   LMP 07/22/2017 (LMP Unknown)   SpO2 100%  or  Vitals:   05/22/18 2029 05/22/18 2030 05/22/18 2040 05/22/18 2045  BP: (!) 111/52     Pulse: 94     Resp:      Temp:      TempSrc:      SpO2:  100% 100% 100%    SVE: 2.5-3%/50%/-3  FHT: baseline rate 145, moderate varibility, +acel, no decel  FHT difficult to trace d/t fetal arrhythmia which is audible, this causes tracing to show up in the 60s, sometimes even 40s, but audibly there is no deceleration. When we are able obtain continuous FHT, it is Category I and reactive, as noted above. I also evaluated this with a bedside ultrasound and watched heart for 5-10 minutes, and observed no decelerations, but did observe accelartions, In addition I was able to observe good fetal movement, as well as breathing movements.   Toco: occasional ctx  Assessment / Plan: 22 y.o. G1P0000 at 2279w0d here for IOL for PD.  Labor: Will start IV Pitocin now. Attempted AROM, but pt very intolerant of exam, and therefore not able to safely do it. Fetal Wellbeing:  Cat I. Will place FSE when ROM Pain Control:  Recommended epidural as this will make exams easier, and she may be able to relax more, but she would like to try nitrous oxide for now  Anticipated MOD:  SVD  Frederik PearJulie P Degele, MD 05/22/2018, 9:52 PM    Attestation of Attending Supervision of OB Fellow: Evaluation and management procedures were performed by the [facility] Fellow under my supervision and collaboration.  I have reviewed the OB Fellow's note and chart, and I agree with the management and plan.  Baldemar LenisK. Meryl Danni Shima, M.D. Attending Obstetrician & Gynecologist, [facility], [facility]  05/22/2018 10:54 PM

## 2018-05-22 NOTE — Progress Notes (Addendum)
Difficulty tracing fhr continuously throughout the day.  At times on tracing, fhr is 50bpm, however upon entering the patient's room, the fhr does not sound like 50bpm and when monitor adjusted it returns to baseline.  Maternal pulse runs in the 90s.  Regina Shaffer, cnm notified of this assessment of agrees it could possibly be a fetal heart rate arrhythmia.  No change in plan of care at this time.  CNM reviewed strip remotely.

## 2018-05-23 ENCOUNTER — Encounter (HOSPITAL_COMMUNITY): Payer: Self-pay

## 2018-05-23 DIAGNOSIS — O480 Post-term pregnancy: Secondary | ICD-10-CM

## 2018-05-23 DIAGNOSIS — Z8759 Personal history of other complications of pregnancy, childbirth and the puerperium: Secondary | ICD-10-CM

## 2018-05-23 DIAGNOSIS — Z3A41 41 weeks gestation of pregnancy: Secondary | ICD-10-CM

## 2018-05-23 MED ORDER — FENTANYL CITRATE (PF) 100 MCG/2ML IJ SOLN
INTRAMUSCULAR | Status: DC | PRN
  Administered 2018-05-23: 100 ug via EPIDURAL

## 2018-05-23 MED ORDER — OXYTOCIN 40 UNITS IN LACTATED RINGERS INFUSION - SIMPLE MED
1.0000 m[IU]/min | INTRAVENOUS | Status: DC
  Administered 2018-05-23: 2 m[IU]/min via INTRAVENOUS

## 2018-05-23 MED ORDER — BUPIVACAINE HCL (PF) 0.25 % IJ SOLN
INTRAMUSCULAR | Status: DC | PRN
  Administered 2018-05-23: 3 mL via EPIDURAL

## 2018-05-23 MED ORDER — LACTATED RINGERS IV SOLN
INTRAVENOUS | Status: DC
  Administered 2018-05-23 (×2): via INTRAUTERINE

## 2018-05-23 NOTE — Progress Notes (Signed)
LABOR PROGRESS NOTE  Rodell PernaSasharay T Matsuura is a 22 y.o. G1P0000 at [redacted]w[redacted]d  admitted for IOL for postdates  Subjective: In to evaluate d/t prolonged deceleration Pt comfortable  Objective: BP (!) 115/32   Pulse (!) 116   Temp 98.1 F (36.7 C) (Axillary)   Resp 18   LMP 07/22/2017 (LMP Unknown)   SpO2 99%  or  Vitals:   05/23/18 0140 05/23/18 0202 05/23/18 0232 05/23/18 0302  BP:  (!) 108/43 (!) 111/48 (!) 115/32  Pulse:  92 (!) 103 (!) 116  Resp:      Temp: 98.1 F (36.7 C)     TempSrc: Axillary     SpO2:        SVE:4.5cm per RN FHT: baseline rate 150, moderate varibility, +acel, variable decels; 5-6 minute prolonged decel.   Toco: ctx q 1-3 min, MVUs > 200  Assessment / Plan: 22 y.o. G1P0000 at [redacted]w[redacted]d here for IOL for PD  Labor: Progressing well on Pitocin. Pitocin d/c now d/t prolonged decel. Plan to restart Pitocin after good recovery Fetal Wellbeing:  Cat II. Will start amnioinfusion given recurrent variables.  Pain Control:  Well-controlled now with epidural Anticipated MOD:  SVD if fetus tolerates   Frederik PearJulie P Chaniyah Jahr, MD 05/23/2018, 3:37 AM

## 2018-05-23 NOTE — Progress Notes (Signed)
LABOR PROGRESS NOTE  Rodell PernaSasharay T Salonga is a 22 y.o. G1P0000 at [redacted]w[redacted]d  admitted for IOL for postdates  Subjective: Pt now comfortable with epidural  Objective: BP (!) 103/39   Pulse (!) 101   Temp 98.6 F (37 C) (Oral)   Resp 18   LMP 07/22/2017 (LMP Unknown)   SpO2 99%  or  Vitals:   05/22/18 2322 05/22/18 2331 05/22/18 2333 05/23/18 0002  BP: 122/68 137/82 137/82 (!) 103/39  Pulse: (!) 114 (!) 112 (!) 112 (!) 101  Resp:      Temp:      TempSrc:      SpO2:        SVE: Dilation: 3 Effacement (%): 60 Cervical Position: Middle Station: -3 Presentation: Vertex Exam by:: Dawnya Grams FHT: baseline rate 150, moderate varibility, +acel, no decel Toco: ctx q 1-3 min  Assessment / Plan: 22 y.o. G1P0000 at [redacted]w[redacted]d here for IOL for PD  Labor: AROM at 1245 with clear fluid. IUPC and FSE placed. Continue to titrate Pitocin until MVUs adequate Fetal Wellbeing:  Cat I. FSE placed Pain Control:  Well-controlled now with epidural Anticipated MOD:  SVD  Frederik PearJulie P Averiana Clouatre, MD 05/23/2018, 12:52 AM

## 2018-05-23 NOTE — Progress Notes (Signed)
Pt with recurrent decel at 0344. Still having contractions q 1.5 min. Terbutaline given.   Now FHT improved; baseline 150, moderate variability, +acel, variables now mild (down to 120) and occasional only. Ctx now q 2-4 min, MVUs remain adequate   --Continue amnioinfusion --Continue to monitor closely --Will re-evaluate around 05:15. If MVUs remain adequate, many not need to restart Pitocin. If needed to restart, will restart down at 142mU/min and titrate by 1.   Raynelle FanningJulie P. Mourad Cwikla, MD OB Fellow

## 2018-05-23 NOTE — Progress Notes (Signed)
LABOR PROGRESS NOTE  Rodell PernaSasharay T Devins is a 22 y.o. G1P0000 at 6139w1d  [facility]  Subjective: Pt comfortable w/ epidural  Objective: BP (!) 117/41   Pulse (!) 102   Temp 98 F (36.7 C) (Oral)   Resp 18   LMP 07/22/2017 (LMP Unknown)   SpO2 99%  or  Vitals:   05/23/18 0832 05/23/18 0906 05/23/18 0959 05/23/18 1046  BP: (!) 122/49 (!) 125/53 (!) 117/57 (!) 117/41  Pulse: (!) 113 (!) 128 (!) 114 (!) 102  Resp:  20 18   Temp:      TempSrc:      SpO2:         Dilation: 6.5 Effacement (%): 70 Cervical Position: Anterior Station: -2 Presentation: Vertex Exam by:: [employer] FHT: 140, mod var, reactive, early decel Uterine activity: IUPC not tracing  Labs: Lab Results  Component Value Date   WBC 8.8 05/22/2018   HGB 10.6 (L) 05/22/2018   HCT 32.1 (L) 05/22/2018   MCV 74.8 (L) 05/22/2018   PLT 265 05/22/2018    Patient Active Problem List   Diagnosis Date Noted  . Post term pregnancy 05/22/2018  . Group B Streptococcus carrier, +RV culture, currently pregnant 05/01/2018  . Bartholin's gland abscess 03/26/2018  . Late prenatal care 12/11/2017  . BMI 50.0-59.9, adult (HCC) 12/10/2017  . Maternal morbid obesity, antepartum (HCC) 12/10/2017  . Supervision of high risk pregnancy, antepartum 12/10/2017  . 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 G1P0000 at 3139w1d here for IOL for PD  Labor: progressing well on pitocin, IUPC replaced, cont to monitor labor, titrate pit appropriately Fetal Wellbeing:  Cat I Pain Control:  Epidural Anticipated MOD:  hopeful NSVD  Garnette GunnerAaron B Yemaya Barnier, MD PGY-1 6/28/201911:20 AM

## 2018-05-23 NOTE — Progress Notes (Signed)
Rodell PernaSasharay T Serafin is a 22 y.o. G1P0000 at 1568w1d  Subjective: Pt comfortable with epidural  Objective: BP (!) 125/53   Pulse (!) 128   Temp 98 F (36.7 C) (Oral)   Resp 20   LMP 07/22/2017 (LMP Unknown)   SpO2 99%  I/O last 3 completed shifts: In: -  Out: 1200 [Urine:1200] No intake/output data recorded.  FHT:  FHR: 145 bpm, variability: moderate,  accelerations:  Present,  decelerations:  Present mild variables UC:   regular, every 2-4 minutes SVE:   Dilation: 6.5 Effacement (%): 70 Station: -2 Exam by:: [employer]  Labs: Lab Results  Component Value Date   WBC 8.8 05/22/2018   HGB 10.6 (L) 05/22/2018   HCT 32.1 (L) 05/22/2018   MCV 74.8 (L) 05/22/2018   PLT 265 05/22/2018    Assessment / Plan: Making cervical change  Labor: Progressing normally Fetal Wellbeing:  Category I Pain Control:  Epidural I/D:  n/a Anticipated MOD:  NSVD  Elsie LincolnKelly Kennard Fildes 05/23/2018, 9:14 AM

## 2018-05-24 MED ORDER — PRENATAL MULTIVITAMIN CH
1.0000 | ORAL_TABLET | Freq: Every day | ORAL | Status: DC
  Administered 2018-05-24 – 2018-05-25 (×2): 1 via ORAL
  Filled 2018-05-24 (×3): qty 1

## 2018-05-24 MED ORDER — SENNOSIDES-DOCUSATE SODIUM 8.6-50 MG PO TABS
2.0000 | ORAL_TABLET | ORAL | Status: DC
  Administered 2018-05-24: 2 via ORAL
  Filled 2018-05-24: qty 2

## 2018-05-24 MED ORDER — ONDANSETRON HCL 4 MG PO TABS: 4.0000 mg | ORAL_TABLET | ORAL | Status: DC | PRN

## 2018-05-24 MED ORDER — WITCH HAZEL-GLYCERIN EX PADS: 1.0000 "application " | MEDICATED_PAD | CUTANEOUS | Status: DC | PRN

## 2018-05-24 MED ORDER — ZOLPIDEM TARTRATE 5 MG PO TABS: 5.0000 mg | ORAL_TABLET | Freq: Every evening | ORAL | Status: DC | PRN

## 2018-05-24 MED ORDER — DIBUCAINE 1 % RE OINT: 1.0000 "application " | TOPICAL_OINTMENT | RECTAL | Status: DC | PRN

## 2018-05-24 MED ORDER — ACETAMINOPHEN 325 MG PO TABS
650.0000 mg | ORAL_TABLET | ORAL | Status: DC | PRN
Start: 2018-05-24 — End: 2018-05-25

## 2018-05-24 MED ORDER — IBUPROFEN 600 MG PO TABS
600.0000 mg | ORAL_TABLET | Freq: Four times a day (QID) | ORAL | Status: DC
  Administered 2018-05-24 – 2018-05-25 (×4): 600 mg via ORAL
  Filled 2018-05-24 (×7): qty 1

## 2018-05-24 MED ORDER — TETANUS-DIPHTH-ACELL PERTUSSIS 5-2.5-18.5 LF-MCG/0.5 IM SUSP: 0.5000 mL | Freq: Once | INTRAMUSCULAR | Status: DC

## 2018-05-24 MED ORDER — SIMETHICONE 80 MG PO CHEW: 80.0000 mg | CHEWABLE_TABLET | ORAL | Status: DC | PRN

## 2018-05-24 MED ORDER — DIPHENHYDRAMINE HCL 25 MG PO CAPS: 25.0000 mg | ORAL_CAPSULE | Freq: Four times a day (QID) | ORAL | Status: DC | PRN

## 2018-05-24 MED ORDER — COCONUT OIL OIL: 1.0000 "application " | TOPICAL_OIL | Status: DC | PRN

## 2018-05-24 MED ORDER — ONDANSETRON HCL 4 MG/2ML IJ SOLN: 4.0000 mg | INTRAMUSCULAR | Status: DC | PRN

## 2018-05-24 MED ORDER — BENZOCAINE-MENTHOL 20-0.5 % EX AERO: 1.0000 "application " | INHALATION_SPRAY | CUTANEOUS | Status: DC | PRN

## 2018-05-24 NOTE — Lactation Note (Signed)
This note was copied from a baby's chart. Lactation Consultation Note Baby 87 hrs old. Rt. Arm limp. Lt. Arm w/trimmers.  Baby appears to cry when moved. Arm not as supported w/baby just swaddled in blanket d/t loose. LC wrapped t-shirt around Rt. Arm, placing Lt. Arm in sleeve and snapping t-shirt to give Rt. Arm support. Baby appears to tolerate movement better.  Mom has very large pendulous breast w/semi flat short shaft nipple at the bottom end of the breast. Hand expression taught w/colostrum noted.  Hand expression demonstrated in spoon. Gave to baby w/gloved finger to assess suck. Baby has high palate. To upper gum line has midline gap w/thick labial frenulum.   It hurts mom to sit on her perineum so she tilts to her sides. Mom did sit in football position and latched baby to [place]. Breast. Baby latched well w/mom using "C" hold.  Baby latched well. Mom kept pulling back on breast. Encouraged mom to let [place] position baby before touch breast other than holding for latch. Tilted  And reposition baby w/head tilted slightly w/chin forward. Mom could tell then baby could breast. Used baby blanket for head support. Baby also has bruising to head,  LC feels baby may need to be supplemented if bili elevates. LC will place DEBP and kit in rm for mom to start pumping for stimulation today. Mom is so sleepy, baby stopped nursing, Buras placed baby in crib. Mom kept falling asleep. Mom would ask LC a question and fall asleep asking it.   Mom will probably have questions on what LC has discussed. Will need BF reviewed. [place] brochure given w/resources, support groups and [place] services. Patient Name: Regina Shaffer [id number] Date: 05/24/2018 Reason for consult: Initial assessment;Difficult latch   Maternal Data Has patient been taught Hand Expression?: Yes Does the patient have breastfeeding experience prior to this delivery?: No  Feeding Feeding Type: Breast Fed Length of feed: 10 min  LATCH  Score Latch: Repeated attempts needed to sustain latch, nipple held in mouth throughout feeding, stimulation needed to elicit sucking reflex.  Audible Swallowing: A few with stimulation  Type of Nipple: Everted at rest and after stimulation(semi flat)  Comfort (Breast/Nipple): Soft / non-tender  Hold (Positioning): Full assist, staff holds infant at breast  LATCH Score: 6  Interventions Interventions: Breast feeding basics reviewed;Support pillows;Assisted with latch;Position options;Skin to skin;Expressed milk;Breast massage;Hand express;Breast compression;Adjust position;Hand pump;Pre-pump if needed  Lactation Tools Discussed/Used     Consult Status Consult Status: Follow-up Date: 05/24/18 Follow-up type: In-patient    Theodoro Kalata 05/24/2018, 5:19 AM

## 2018-05-24 NOTE — Progress Notes (Signed)
CSW received consult for patient due to her history of adjustment disorder with mixed disturbance of emotions and conduct. However, CSW reviewed chart and this diagnosis stemmed from an event in July 2018 where the patient was under the influence of an unknown substance ("pink pill") at a club that caused her to have a single episode of altered mental status. Patient reported that she did not know what the substance was and that she had no prior psychiatric history. Patient was discharged home [facility] concerns [facility]. 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 CSW will screen this consult out.  Edwin Dadaarol Gaynel Schaafsma, MSW, LCSW-A Clinical Social Worker [facility] [phone]

## 2018-05-24 NOTE — Progress Notes (Addendum)
Patient and FOB asleep. Baby in crib awake and content. Patients mother at bedside and denies needs at this time.

## 2018-05-24 NOTE — Lactation Note (Signed)
This note was copied from a baby's chart. Lactation Consultation Note  Patient Name: Regina Shaffer [id number]'s Date: 05/24/2018 Reason for consult: Follow-up assessment   Mother called for assistance w/ breastfeeding.  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 Rt arm wrapped with tshirt. Mother has large breasts with evert nipples.  Reviewed hand expression. Assisted mother with latching baby in football hold on L side and again in cross cradle of Rt side.   With teacup hold & assistance baby was able to latch. Baby comes off and on breast but w/ guidance he was able to sustain latch for approx 10 min. Set up DEBP and assisted mother w/ pumping for approx 10 min until she fell asleep. Discussed with FOB to clean pump parts after sessions. Recommend mother pump q 2.5-3 hours.  Mom encouraged to feed baby 8-12 times/24 hours and with feeding cues.     Maternal Data Has patient been taught Hand Expression?: Yes  Feeding Feeding Type: Breast Fed Length of feed: 10 min  LATCH Score Latch: Grasps breast easily, tongue down, lips flanged, rhythmical sucking.  Audible Swallowing: A few with stimulation  Type of Nipple: Everted at rest and after stimulation  Comfort (Breast/Nipple): Soft / non-tender  Hold (Positioning): Full assist, staff holds infant at breast  LATCH Score: 7  Interventions Interventions: Breast feeding basics reviewed;Assisted with latch;Skin to skin;Hand express;Adjust position;Support pillows;Position options;DEBP  Lactation Tools Discussed/Used Pump Review: Setup, frequency, and cleaning Initiated by:: Dahlia Byesuth Kelii Chittum RN IBCLC Date initiated:: 05/24/18   Consult Status Consult Status: Follow-up Date: 05/25/18 Follow-up type: In-patient    Dahlia ByesBerkelhammer, Kery Batzel [facility] 05/24/2018, 10:17 AM

## 2018-05-24 NOTE — Anesthesia Postprocedure Evaluation (Signed)
Anesthesia Post Note  Patient: Rodell PernaSasharay T Keng  Procedure(s) Performed: AN AD HOC LABOR EPIDURAL     Patient location during evaluation: Mother Baby Anesthesia Type: Epidural Level of consciousness: awake and alert Pain management: pain level controlled Vital Signs Assessment: post-procedure vital signs reviewed and stable Respiratory status: spontaneous breathing, nonlabored ventilation and respiratory function stable Cardiovascular status: stable Postop Assessment: no headache, no backache, epidural receding and no apparent nausea or vomiting Anesthetic complications: no    Last Vitals:  Vitals:   05/24/18 0030 05/24/18 0430  BP: 129/80 113/66  Pulse: (!) 110 (!) 102  Resp: 18 18  Temp: 37.2 C 36.8 C  SpO2: 100% 100%    Last Pain:  Vitals:   05/24/18 0748  TempSrc:   PainSc: Asleep   Pain Goal:                 Rica RecordsICKELTON,Jyll Tomaro

## 2018-05-24 NOTE — Progress Notes (Signed)
Post Partum Day 1 Subjective: no complaints, up ad lib, voiding and tolerating PO  Objective: Blood pressure 113/66, pulse (!) 102, temperature 98.2 F (36.8 C), temperature source Oral, resp. rate 18, last menstrual period 07/22/2017, SpO2 100 %, unknown if currently breastfeeding.  Physical Exam:  General: alert, cooperative and no distress Lochia: appropriate Uterine Fundus: firm Incision: N/A DVT Evaluation: No evidence of DVT seen on physical exam.  Recent Labs    05/22/18 0827  HGB 10.6*  HCT 32.1*    Assessment/Plan: Plan for discharge tomorrow   LOS: 2 days   Caryl AdaJazma Madilynn Montante, DO 05/24/2018, 9:45 AM

## 2018-05-25 MED ORDER — IBUPROFEN 600 MG PO TABS: 600.0000 mg | ORAL_TABLET | Freq: Four times a day (QID) | ORAL | 0 refills | Status: AC

## 2018-05-25 NOTE — Lactation Note (Signed)
This note was copied from a baby's chart. Lactation Consultation Note Baby 32 hrs old. Mom states she's going home today. Baby has Cephalhematoma and Rt. Bradial Plexus Injury. Noted improved movement in Rt. Arm.  Baby is is high risk for jaundice. Discussed monitoring for s/sx of jaundice which includes increased sleepiness and not cueing for feedings and or decreased in output. Stressed importance of notifying MD if that occurs.   Discussed feeding positions. Assisted in side lying, cradle, and football. Mom has back pain, encouraged support and props for comfort. Discussed milk composition, breast filling, engorgement, prevention, milk storage, supply and demand. Mom has WIC. Reminded of outside recourses.   Patient Name: Regina Shaffer [id number]'s Date: 05/25/2018 Reason for consult: Follow-up assessment   Maternal Data    Feeding Feeding Type: Breast Fed Length of feed: 30 min(still BF)  LATCH Score Latch: Grasps breast easily, tongue down, lips flanged, rhythmical sucking.  Audible Swallowing: Spontaneous and intermittent  Type of Nipple: Everted at rest and after stimulation  Comfort (Breast/Nipple): Soft / non-tender  Hold (Positioning): Assistance needed to correctly position infant at breast and maintain latch.  LATCH Score: 9  Interventions Interventions: Breast feeding basics reviewed;Support pillows;Assisted with latch;Position options;Skin to skin;Breast massage;Hand express;Breast compression;Adjust position  Lactation Tools Discussed/Used     Consult Status Consult Status: Complete Date: 05/25/18    Charyl DancerCARVER, [name] 05/25/2018, 4:59 AM

## 2018-05-25 NOTE — Discharge Instructions (Signed)
Vaginal Delivery, Care After °Refer to this sheet in the next few weeks. These instructions provide you with information about caring for yourself after vaginal delivery. Your health care provider may also give you more specific instructions. Your treatment has been planned according to current medical practices, but problems sometimes occur. Call your health care provider if you have any problems or questions. °What can I expect after the procedure? °After vaginal delivery, it is common to have: °· Some bleeding from your vagina. °· Soreness in your abdomen, your vagina, and the area of skin between your vaginal opening and your anus (perineum). °· Pelvic cramps. °· Fatigue. ° °Follow these instructions at home: °Medicines °· Take over-the-counter and prescription medicines only as told by your health care provider. °· If you were prescribed an antibiotic medicine, take it as told by your health care provider. Do not stop taking the antibiotic until it is finished. °Driving ° °· Do not drive or operate heavy machinery while taking prescription pain medicine. °· Do not drive for 24 hours if you received a sedative. °Lifestyle °· Do not drink alcohol. This is especially important if you are breastfeeding or taking medicine to relieve pain. °· Do not use tobacco products, including cigarettes, chewing tobacco, or e-cigarettes. If you need help quitting, ask your health care provider. °Eating and drinking °· Drink at least 8 eight-ounce glasses of water every day unless you are told not to by your health care provider. If you choose to breastfeed your baby, you may need to drink more water than this. °· Eat high-fiber foods every day. These foods may help prevent or relieve constipation. High-fiber foods include: °? Whole grain cereals and breads. °? Brown rice. °? Beans. °? Fresh fruits and vegetables. °Activity °· Return to your normal activities as told by your health care provider. Ask your health care provider  what activities are safe for you. °· Rest as much as possible. Try to rest or take a nap when your baby is sleeping. °· Do not lift anything that is heavier than your baby or 10 lb (4.5 kg) until your health care provider says that it is safe. °· Talk with your health care provider about when you can engage in sexual activity. This may depend on your: °? Risk of infection. °? Rate of healing. °? Comfort and desire to engage in sexual activity. °Vaginal Care °· If you have an episiotomy or a vaginal tear, check the area every day for signs of infection. Check for: °? More redness, swelling, or pain. °? More fluid or blood. °? Warmth. °? Pus or a bad smell. °· Do not use tampons or douches until your health care provider says this is safe. °· Watch for any blood clots that may pass from your vagina. These may look like clumps of dark red, brown, or black discharge. °General instructions °· Keep your perineum clean and dry as told by your health care provider. °· Wear loose, comfortable clothing. °· Wipe from front to back when you use the toilet. °· Ask your health care provider if you can shower or take a bath. If you had an episiotomy or a perineal tear during labor and delivery, your health care provider may tell you not to take baths for a certain length of time. °· Wear a bra that supports your breasts and fits you well. °· If possible, have someone help you with household activities and help care for your baby for at least a few days after   you leave the hospital. °· Keep all follow-up visits for you and your baby as told by your health care provider. This is important. °Contact a health care provider if: °· You have: °? Vaginal discharge that has a bad smell. °? Difficulty urinating. °? Pain when urinating. °? A sudden increase or decrease in the frequency of your bowel movements. °? More redness, swelling, or pain around your episiotomy or vaginal tear. °? More fluid or blood coming from your episiotomy or  vaginal tear. °? Pus or a bad smell coming from your episiotomy or vaginal tear. °? A fever. °? A rash. °? Little or no interest in activities you used to enjoy. °? Questions about caring for yourself or your baby. °· Your episiotomy or vaginal tear feels warm to the touch. °· Your episiotomy or vaginal tear is separating or does not appear to be healing. °· Your breasts are painful, hard, or turn red. °· You feel unusually sad or worried. °· You feel nauseous or you vomit. °· You pass large blood clots from your vagina. If you pass a blood clot from your vagina, save it to show to your health care provider. Do not flush blood clots down the toilet without having your health care provider look at them. °· You urinate more than usual. °· You are dizzy or light-headed. °· You have not breastfed at all and you have not had a menstrual period for 12 weeks after delivery. °· You have stopped breastfeeding and you have not had a menstrual period for 12 weeks after you stopped breastfeeding. °Get help right away if: °· You have: °? Pain that does not go away or does not get better with medicine. °? Chest pain. °? Difficulty breathing. °? Blurred vision or spots in your vision. °? Thoughts about hurting yourself or your baby. °· You develop pain in your abdomen or in one of your legs. °· You develop a severe headache. °· You faint. °· You bleed from your vagina so much that you fill two sanitary pads in one hour. °This information is not intended to replace advice given to you by your health care provider. Make sure you discuss any questions you have with your health care provider. °Document Released: 11/09/2000 Document Revised: 04/25/2016 Document Reviewed: 11/27/2015 °Elsevier Interactive Patient Education © 2018 [employer] ° °

## 2018-05-25 NOTE — Discharge Summary (Signed)
OB Discharge Summary     Patient Name: Regina Shaffer DOB: [birth year] MRN: [id number]  Date of admission: 05/22/2018 Delivering MD: Pincus LargePHELPS, JAZMA Y   Date of discharge: 05/25/2018  Admitting diagnosis: INDUCTION Intrauterine pregnancy: 139w1d     Secondary diagnosis:  Active Problems:   Post term pregnancy   Status post vacuum-assisted vaginal delivery   Shoulder dystocia during labor and delivery  Additional problems: s/p vacuum-assisted vaginal delivery     Discharge diagnosis: Term Pregnancy Delivered                                                                                                Post partum procedures:none  Augmentation: AROM, Pitocin and Cytotec  Complications: None  Hospital course:  Induction of Labor With Vaginal Delivery   22 y.o. yo G1P1001 at 8039w1d was admitted to the hospital 05/22/2018 for induction of labor.  Indication for induction: Postdates.  Patient had an uncomplicated labor course as follows: Membrane Rupture Time/Date: 12:36 AM ,05/23/2018   Intrapartum Procedures: Episiotomy: None [1]                                         Lacerations:  Sulcus [9]  Patient had delivery of a Viable infant.  Information for the patient's newborn:  Regina Shaffer [[id number]]  Delivery Method: Vaginal, Vacuum (Extractor)(Filed from Delivery Summary)   05/23/2018  Details of delivery can be found in separate delivery note.  Patient had a routine postpartum course. Patient is discharged home 05/25/18.  Physical exam  Vitals:   05/24/18 1544 05/24/18 1621 05/24/18 2316 05/25/18 0616  BP: (!) 131/93 121/81 134/63 (!) 112/54  Pulse: (!) 105 85 98 89  Resp: 18   18  Temp: 98.1 F (36.7 C)  97.7 F (36.5 C) 98 F (36.7 C)  TempSrc: Oral  Oral   SpO2:    97%   General: alert, cooperative and no distress Lochia: appropriate Uterine Fundus: firm Incision: N/A DVT Evaluation: No evidence of DVT seen on physical exam. Labs: Lab Results  Component  Value Date   WBC 8.8 05/22/2018   HGB 10.6 (L) 05/22/2018   HCT 32.1 (L) 05/22/2018   MCV 74.8 (L) 05/22/2018   PLT 265 05/22/2018   CMP Latest Ref Rng & Units 12/10/2017  Glucose 65 - 99 mg/dL 782(N110(H)  BUN 6 - 20 mg/dL 5(L)  Creatinine 5.620.57 - 1.00 mg/dL 1.30(Q0.54(L)  Sodium 657134 - 846144 mmol/L 137  Potassium 3.5 - 5.2 mmol/L 4.3  Chloride 96 - 106 mmol/L 104  CO2 20 - 29 mmol/L 20  Calcium 8.7 - 10.2 mg/dL 8.8  Total Protein 6.0 - 8.5 g/dL 6.2  Total Bilirubin 0.0 - 1.2 mg/dL <9.6<0.2  Alkaline Phos 39 - 117 IU/L 51  AST 0 - 40 IU/L 16  ALT 0 - 32 IU/L 12    Discharge instruction: per After Visit Summary and "Baby and Me Booklet".  After visit meds:  Allergies as of 05/25/2018   No Known  Allergies     Medication List    TAKE these medications   ibuprofen 600 MG tablet Commonly known as:  ADVIL,MOTRIN Take 1 tablet (600 mg total) by mouth every 6 (six) hours.   PRENATAL VITAMIN PO Take by mouth.       Diet: routine diet  Activity: Advance as tolerated. Pelvic rest for 6 weeks.   Outpatient follow up: 4 weeks Follow up Appt: Future Appointments  Date Time Provider Department Center  06/26/2018 11:15 AM [place] Bing, MD [facility]   Follow up Visit:No follow-ups on file.  Postpartum contraception: IUD    Newborn Data: Live born female  Birth Weight: 8 lb 0.9 oz (3655 g) APGAR: 6, 9  Newborn Delivery   Time head delivered:  05/23/2018 20:46:00 Birth date/time:  05/23/2018 20:47:00 Delivery type:  Vaginal, Vacuum (Extractor)     Baby Feeding: Breast Disposition:home with mother   05/25/2018 Regina Pear, MD

## 2018-05-28 ENCOUNTER — Inpatient Hospital Stay (HOSPITAL_COMMUNITY): Admission: RE | Admit: 2018-05-28 | Payer: Medicaid Other | Source: Ambulatory Visit

## 2018-06-25 NOTE — Progress Notes (Deleted)
Post Partum Exam  Regina Shaffer is a 22 y.o. 761P1001 female who presents for a postpartum visit. She is {1-10:13787} {time; units:18646} postpartum following a {delivery:12449}. I have fully reviewed the prenatal and intrapartum course. The delivery was at 41 gestational weeks.  Anesthesia: {anesthesia types:812}. Postpartum course has been ***. Baby's course has been uncomplicated. Baby is feeding by breast. Bleeding {vag bleed:12292}. Bowel function is {normal:32111}. Bladder function is {normal:32111}. Patient {is/is not:9024} sexually active. Contraception method is {contraceptive method:5051}. Postpartum depression screening:neg  {Common ambulatory SmartLinks:19316} Last pap smear done *** and was {Desc; normal/abnormal:11317::"Normal"}  Review of Systems {ros; complete:30496}    Objective:  unknown if currently breastfeeding.  General:  {gen appearance:16600}   Breasts:  {breast exam:1202::"inspection negative, no nipple discharge or bleeding, no masses or nodularity palpable"}  Lungs: {lung exam:16931}  Heart:  {heart exam:5510}  Abdomen: {abdomen exam:16834}   Vulva:  {labia exam:12198}  Vagina: {vagina exam:12200}  Cervix:  {cervix exam:14595}  Corpus: {uterus exam:12215}  Adnexa:  {adnexa exam:12223}  Rectal Exam: {rectal/vaginal exam:12274}        Assessment:    *** postpartum exam. Pap smear {done:10129} at today's visit.   Plan:   1. Contraception: {method:5051} 2. *** 3. Follow up in: {1-10:13787} {time; units:19136} or as needed.

## 2018-06-26 ENCOUNTER — Encounter: Payer: Self-pay | Admitting: Obstetrics and Gynecology

## 2018-06-26 ENCOUNTER — Ambulatory Visit: Payer: Self-pay | Admitting: Obstetrics and Gynecology

## 2018-06-26 NOTE — Progress Notes (Signed)
Patient did not keep her postpartum appointment for 06/26/2018.  Cornelia Copaharlie Lowell Mcgurk, Jr MD Attending [facility](Faculty Practice)

## 2018-08-01 ENCOUNTER — Other Ambulatory Visit: Payer: Self-pay | Admitting: Ophthalmology

## 2018-08-01 DIAGNOSIS — R5381 Other malaise: Secondary | ICD-10-CM

## 2018-08-04 ENCOUNTER — Other Ambulatory Visit: Payer: Self-pay | Admitting: Ophthalmology

## 2018-08-04 DIAGNOSIS — H469 Unspecified optic neuritis: Secondary | ICD-10-CM

## 2018-08-18 ENCOUNTER — Ambulatory Visit: Payer: Self-pay | Admitting: Podiatry

## 2018-08-21 IMAGING — DX DG CHEST 2V
2 series · 2 of 2 positions shown · non-contrast
Comparison: None.

CLINICAL DATA: Cough fever and chills with fatigue

EXAM:
CHEST  2 VIEW

[chest pa]
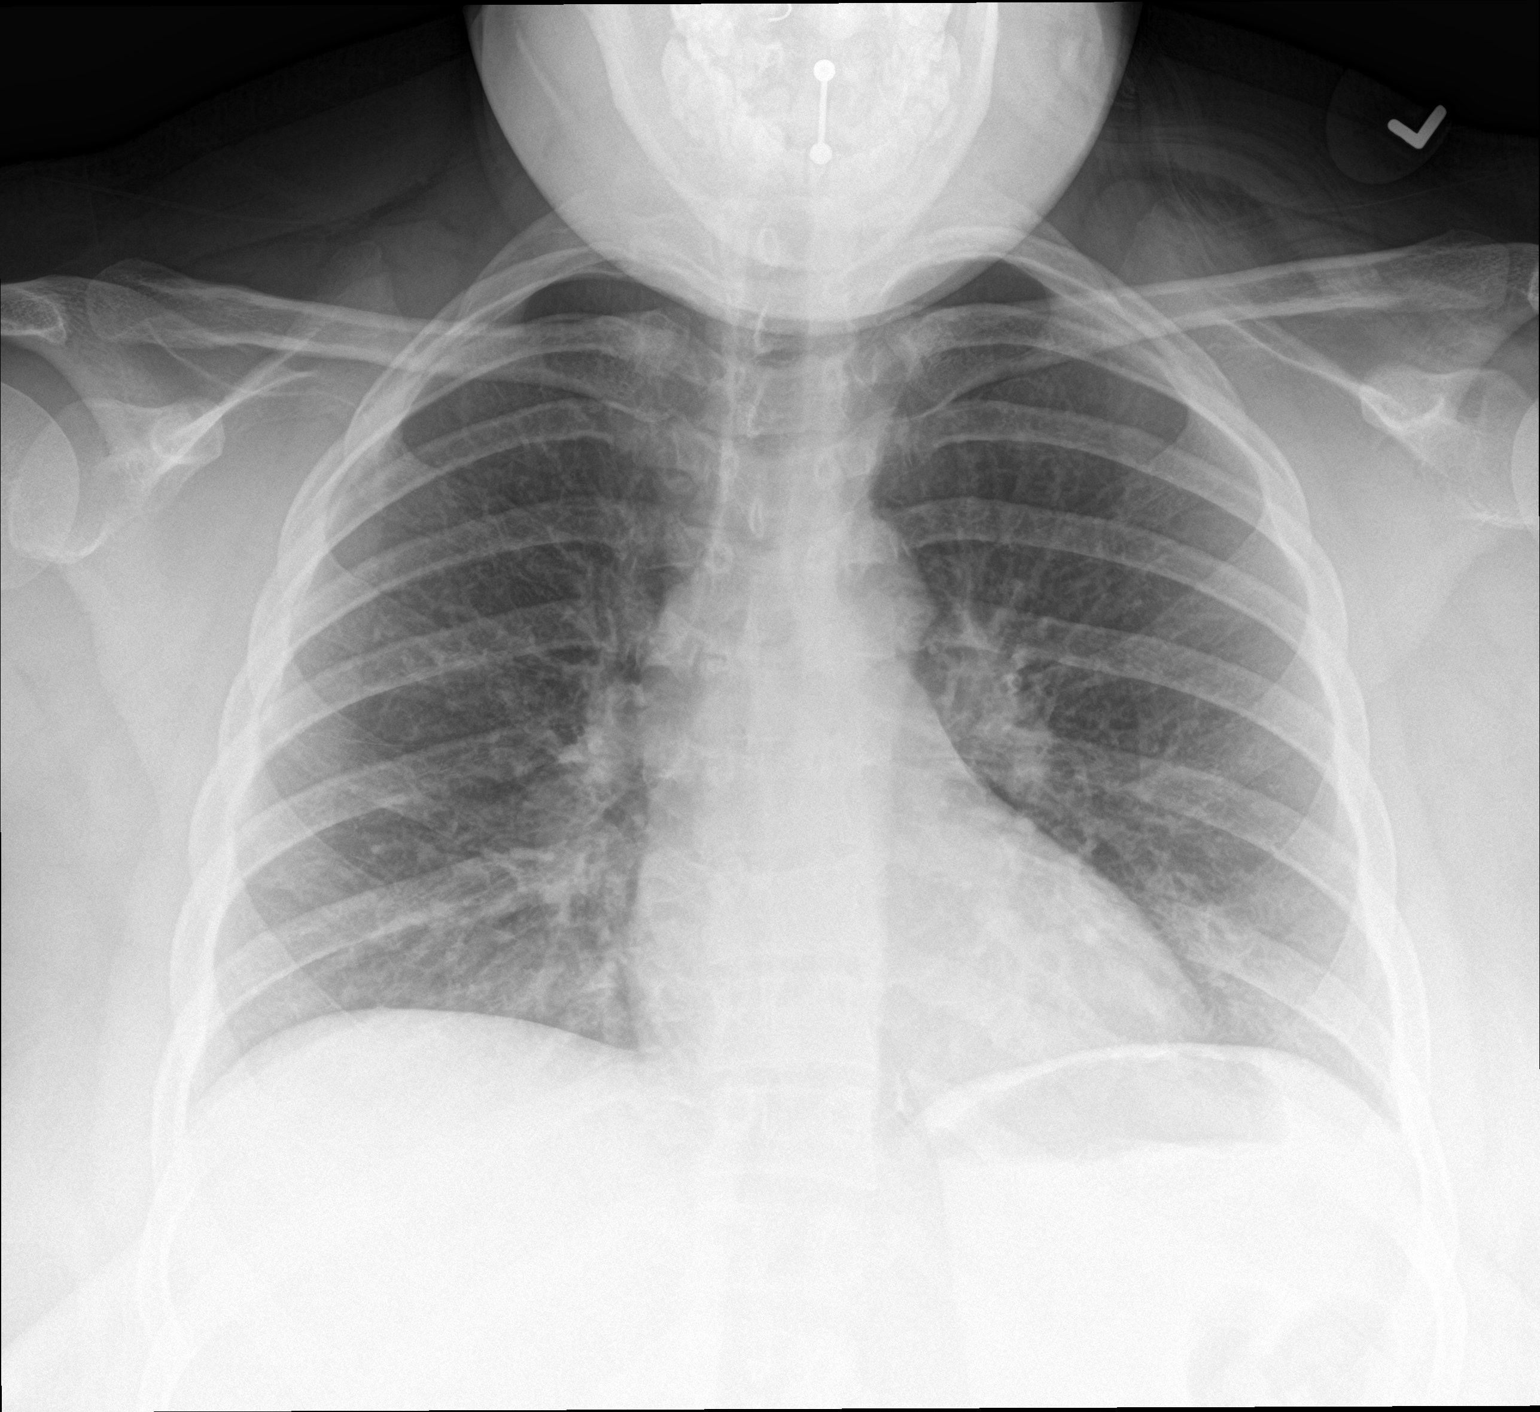

[chest lat]
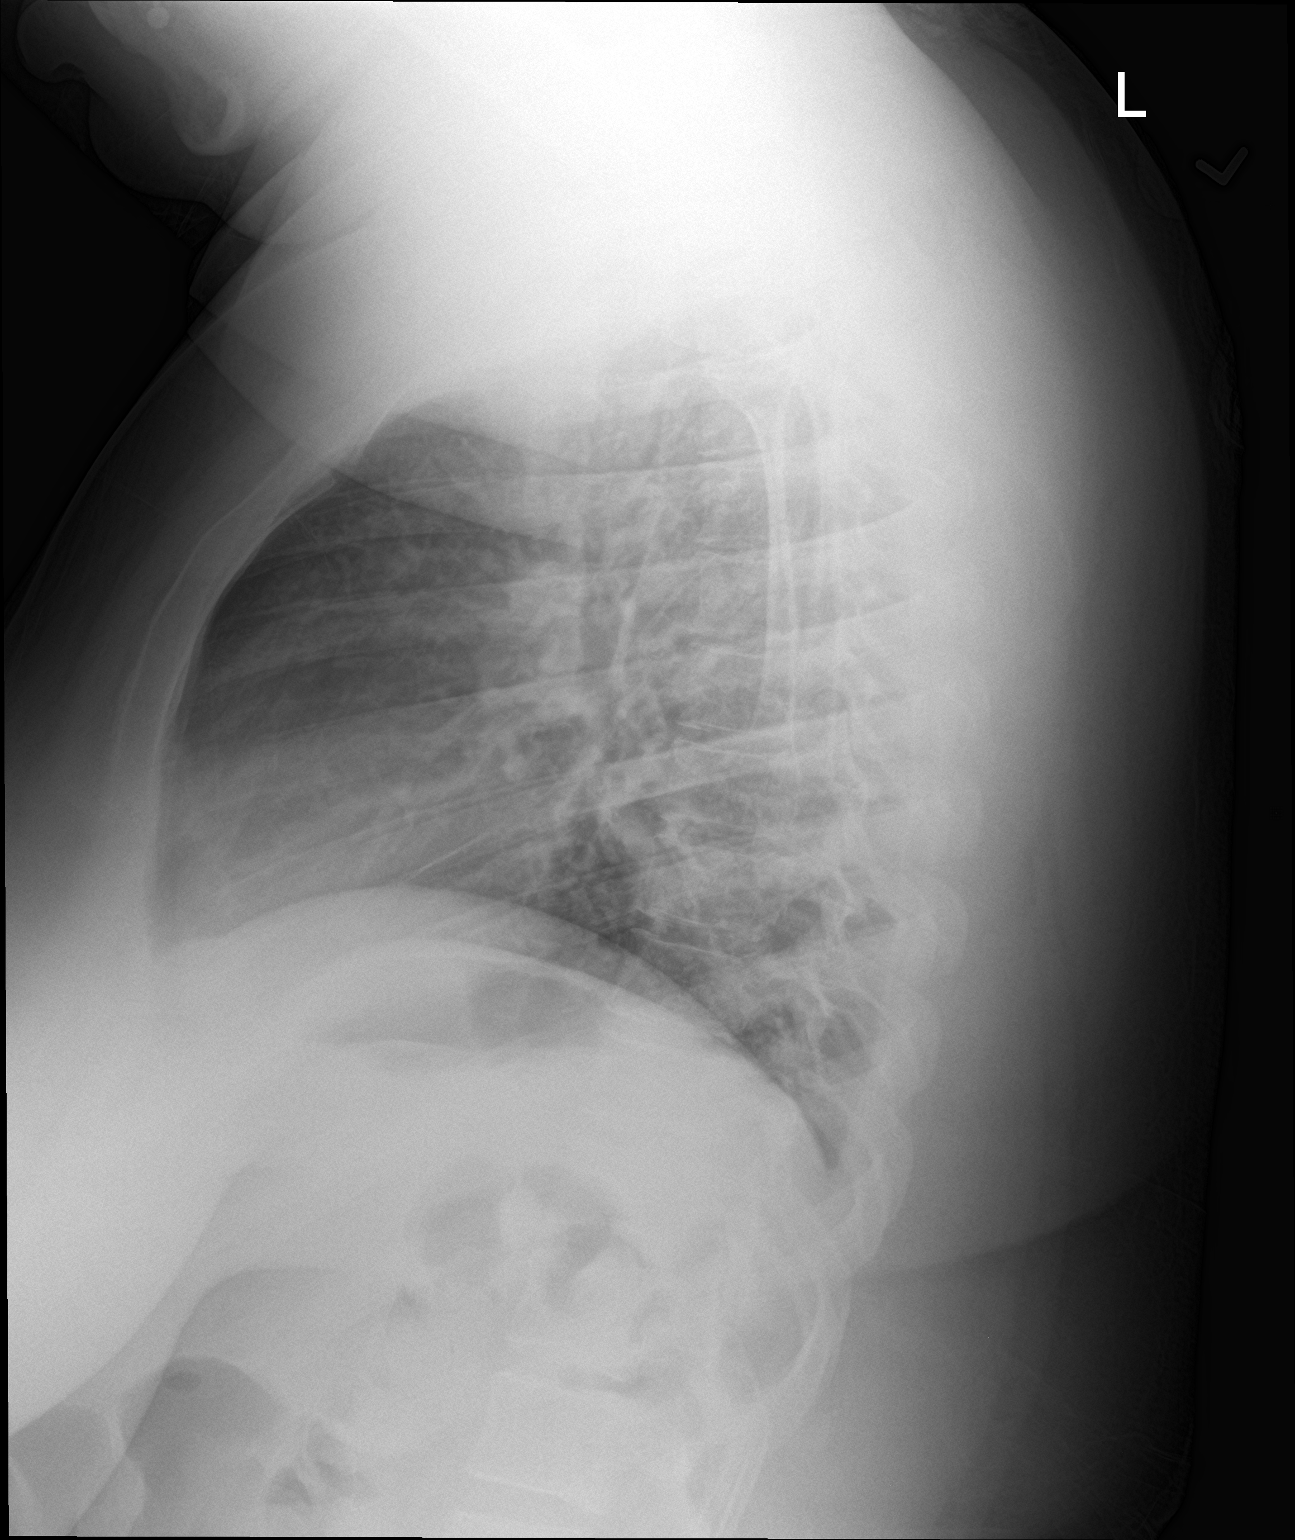

[2 of 2 positions shown; findings below may reference images not displayed]

FINDINGS: The heart size and mediastinal contours are within normal limits.
Both lungs are clear. The visualized skeletal structures are
unremarkable.
IMPRESSION: No active cardiopulmonary disease.

## 2018-08-22 ENCOUNTER — Ambulatory Visit: Payer: Self-pay | Admitting: Podiatry

## 2018-09-19 ENCOUNTER — Other Ambulatory Visit: Payer: Self-pay

## 2019-08-30 IMAGING — US US MFM OB TRANSVAGINAL
1 series · 15 of 28 positions shown · non-contrast
Comparison: none

[Series 1: us mfm ob transvaginal · 30 acquisitions, 15 frames shown]
[im 1/30]
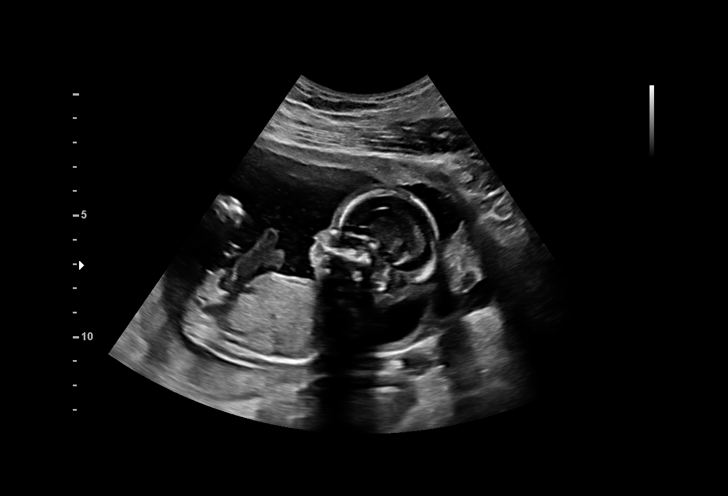
[im 3/30]
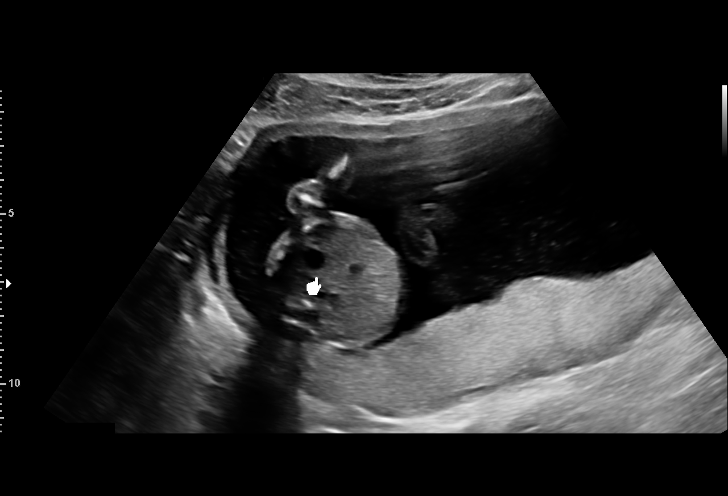
[im 5/30]
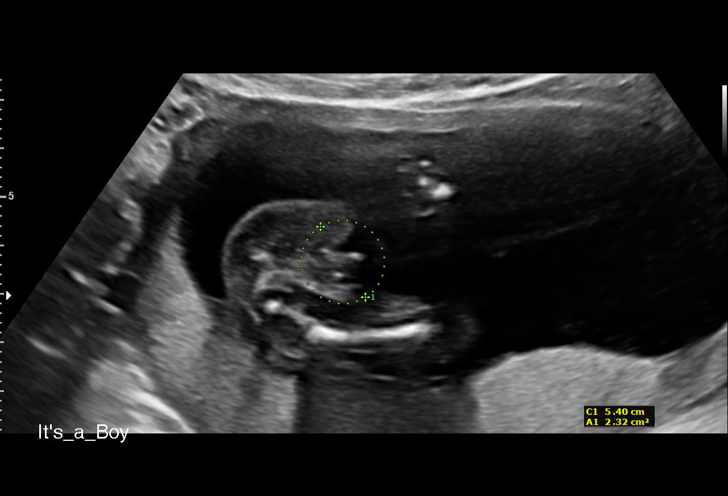
[im 7/30]
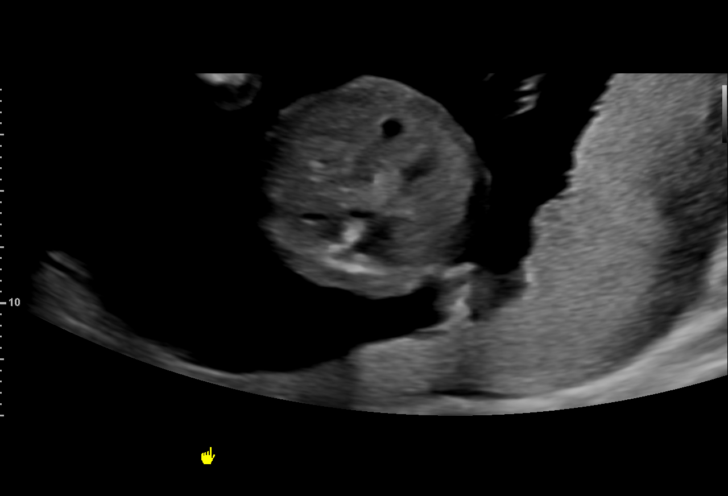
[im 9/30]
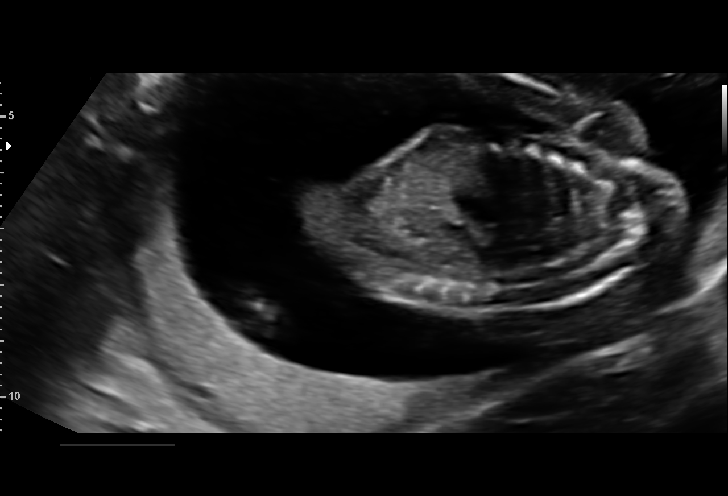
[im 11/30]
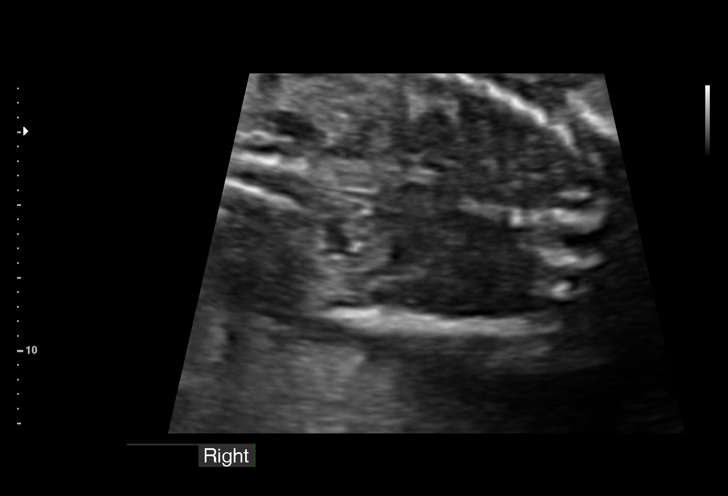
[im 13/30]
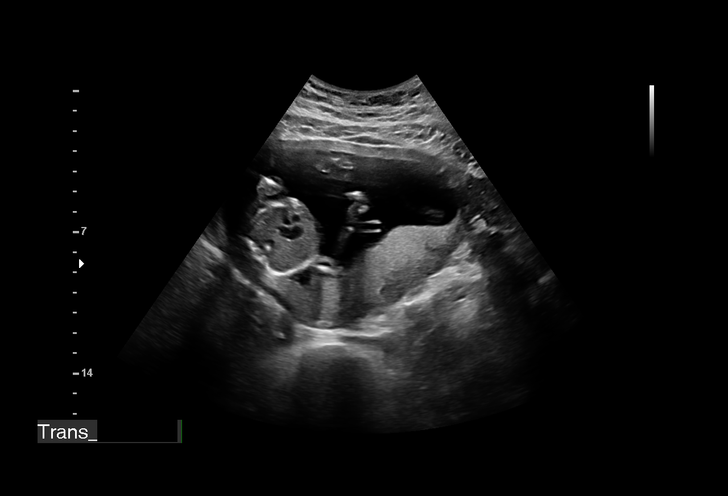
[im 16/30]
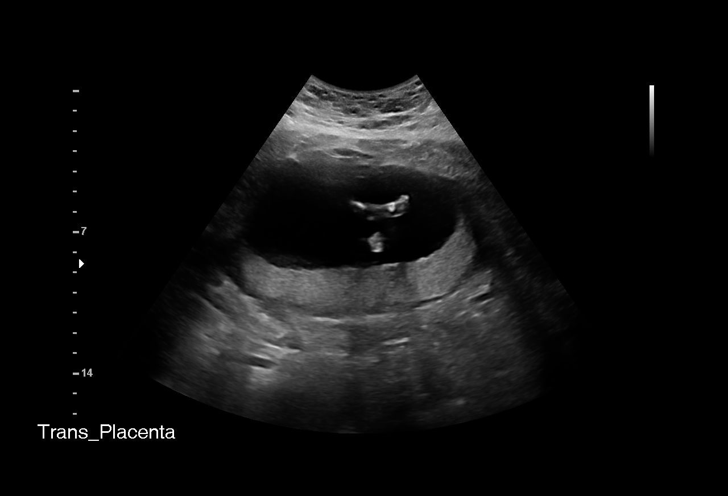
[im 17/30]
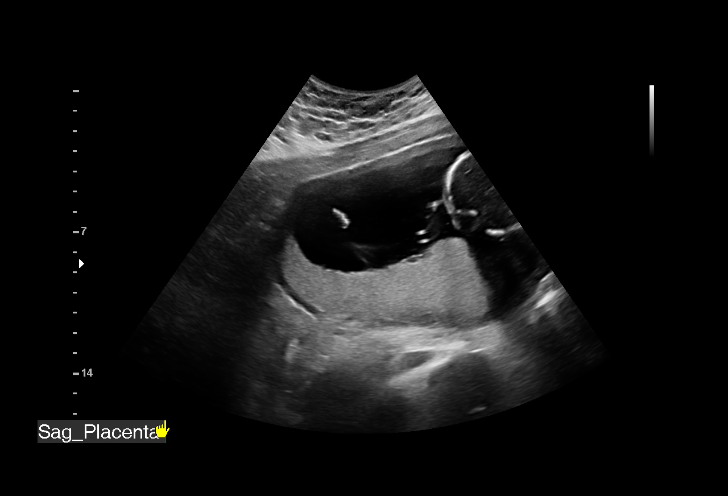
[im 19/30]
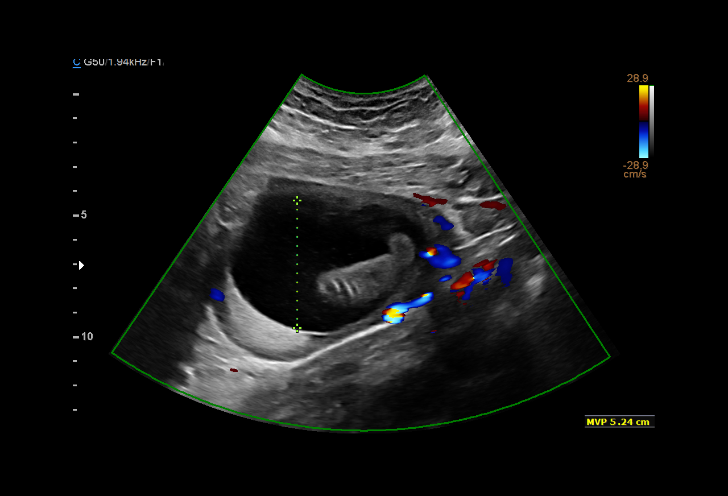
[im 21/30]
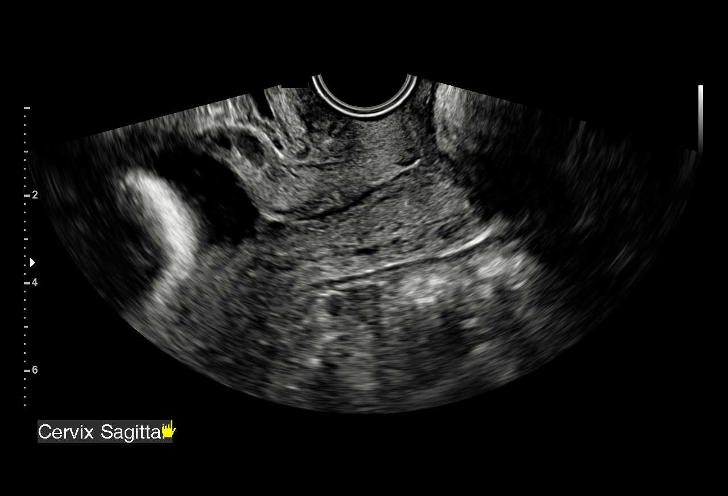
[im 23/30]
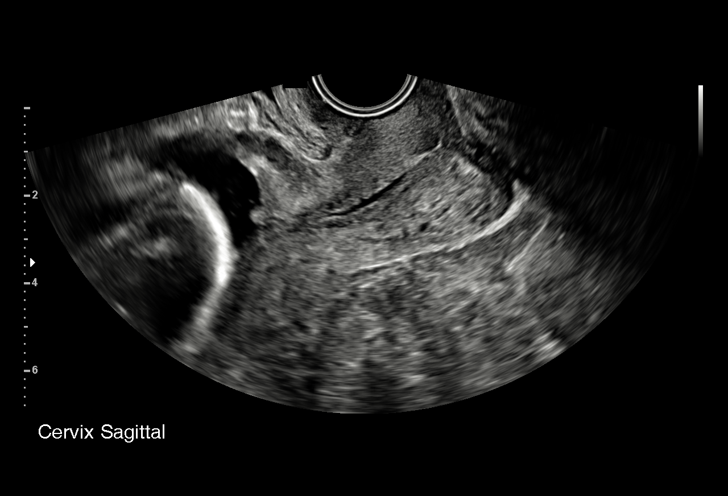
[im 25/30]
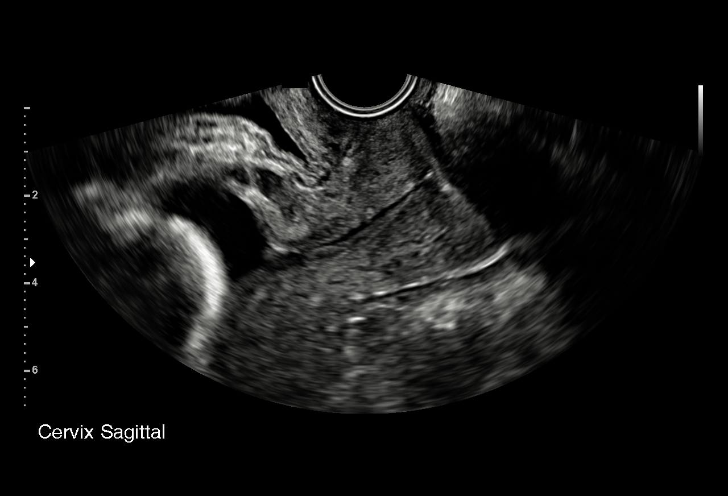
[im 27/30]
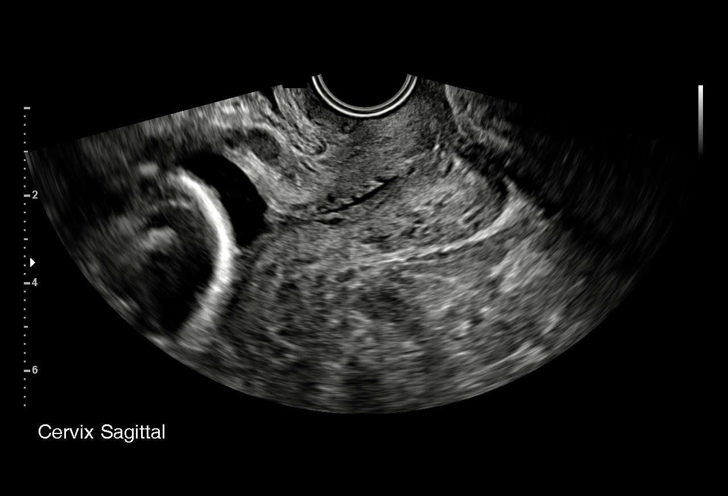
[im 30/30]
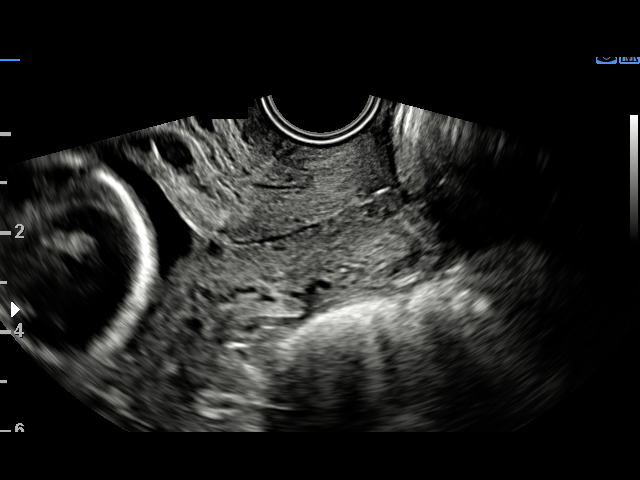

[15 of 28 positions shown; findings below may reference images not displayed]

Attending:        Theodhoraq Magjistari       Secondary Phy.:   [facility]-
MAU/Triage

1  DAJA GOODIN          [id number]      [id number]     [phone]
Indications

18 weeks gestation of pregnancy
Encounter for cervical length
Obesity complicating pregnancy, second
trimester
OB History

Blood Type:            Height:  5'0"   Weight (lb):  280       BMI:
Gravidity:    1
Fetal Evaluation

Num Of Fetuses:     1
Fetal Heart         144
Rate(bpm):
Cardiac Activity:   Observed
Presentation:       Cephalic
Placenta:           Posterior

Amniotic Fluid
AFI FV:      Subjectively within normal limits

Largest Pocket(cm)
5.24
Gestational Age

LMP:           20w 4d        Date:  07/22/17                 EDD:   04/28/18
Best:          18w 1d     Det. By:  U/S  (12/01/17)          EDD:   05/15/18
Anatomy

Stomach:               Appears normal, left   Bladder:                Appears normal
sided
Kidneys:               Appear normal

Other:  Fetus appears to be a male.
Cervix Uterus Adnexa

Cervix
Length:            3.4  cm.
Measured transvaginally. Appears closed, without funnelling.  LUS
contraction
Impression

SIUP at 03w0d
active fetus
cervix is long and closed, conferring low risk for preterm
delivery
no previa
Recommendations

Follow up as clinically indicated.

## 2020-02-22 DIAGNOSIS — Z3A31 31 weeks gestation of pregnancy: Secondary | ICD-10-CM | POA: Diagnosis not present

## 2020-02-22 DIAGNOSIS — O30042 Twin pregnancy, dichorionic/diamniotic, second trimester: Secondary | ICD-10-CM | POA: Diagnosis not present

## 2020-03-09 DIAGNOSIS — O30042 Twin pregnancy, dichorionic/diamniotic, second trimester: Secondary | ICD-10-CM | POA: Diagnosis not present

## 2020-04-07 DIAGNOSIS — O99214 Obesity complicating childbirth: Secondary | ICD-10-CM | POA: Diagnosis not present

## 2020-04-07 DIAGNOSIS — R079 Chest pain, unspecified: Secondary | ICD-10-CM | POA: Diagnosis not present

## 2020-04-07 DIAGNOSIS — O9902 Anemia complicating childbirth: Secondary | ICD-10-CM | POA: Diagnosis not present

## 2020-04-07 DIAGNOSIS — D509 Iron deficiency anemia, unspecified: Secondary | ICD-10-CM | POA: Diagnosis not present

## 2020-04-07 DIAGNOSIS — Z87891 Personal history of nicotine dependence: Secondary | ICD-10-CM | POA: Diagnosis not present

## 2020-04-07 DIAGNOSIS — O30043 Twin pregnancy, dichorionic/diamniotic, third trimester: Secondary | ICD-10-CM | POA: Diagnosis not present

## 2020-04-07 DIAGNOSIS — O411030 Infection of amniotic sac and membranes, unspecified, third trimester, not applicable or unspecified: Secondary | ICD-10-CM | POA: Diagnosis not present

## 2020-04-07 DIAGNOSIS — O99893 Other specified diseases and conditions complicating puerperium: Secondary | ICD-10-CM | POA: Diagnosis not present

## 2020-04-07 DIAGNOSIS — Z3A38 38 weeks gestation of pregnancy: Secondary | ICD-10-CM | POA: Diagnosis not present

## 2020-04-07 DIAGNOSIS — O99354 Diseases of the nervous system complicating childbirth: Secondary | ICD-10-CM | POA: Diagnosis not present

## 2020-04-07 DIAGNOSIS — G4733 Obstructive sleep apnea (adult) (pediatric): Secondary | ICD-10-CM | POA: Diagnosis not present

## 2020-04-07 DIAGNOSIS — Z79899 Other long term (current) drug therapy: Secondary | ICD-10-CM | POA: Diagnosis not present

## 2020-04-07 DIAGNOSIS — Z8759 Personal history of other complications of pregnancy, childbirth and the puerperium: Secondary | ICD-10-CM | POA: Diagnosis not present

## 2020-04-07 DIAGNOSIS — O134 Gestational [pregnancy-induced] hypertension without significant proteinuria, complicating childbirth: Secondary | ICD-10-CM | POA: Diagnosis not present

## 2020-04-07 DIAGNOSIS — Z20822 Contact with and (suspected) exposure to covid-19: Secondary | ICD-10-CM | POA: Diagnosis not present

## 2020-04-07 DIAGNOSIS — R000 Tachycardia, unspecified: Secondary | ICD-10-CM | POA: Diagnosis not present

## 2020-08-26 DIAGNOSIS — Z419 Encounter for procedure for purposes other than remedying health state, unspecified: Secondary | ICD-10-CM | POA: Diagnosis not present

## 2020-08-29 DIAGNOSIS — Z113 Encounter for screening for infections with a predominantly sexual mode of transmission: Secondary | ICD-10-CM | POA: Diagnosis not present

## 2020-09-08 DIAGNOSIS — R7303 Prediabetes: Secondary | ICD-10-CM | POA: Diagnosis not present

## 2020-09-08 DIAGNOSIS — K219 Gastro-esophageal reflux disease without esophagitis: Secondary | ICD-10-CM | POA: Diagnosis not present

## 2020-09-08 DIAGNOSIS — E559 Vitamin D deficiency, unspecified: Secondary | ICD-10-CM | POA: Diagnosis not present

## 2020-09-08 DIAGNOSIS — N920 Excessive and frequent menstruation with regular cycle: Secondary | ICD-10-CM | POA: Diagnosis not present

## 2020-09-08 DIAGNOSIS — R718 Other abnormality of red blood cells: Secondary | ICD-10-CM | POA: Diagnosis not present

## 2020-09-26 DIAGNOSIS — Z419 Encounter for procedure for purposes other than remedying health state, unspecified: Secondary | ICD-10-CM | POA: Diagnosis not present

## 2020-10-25 DIAGNOSIS — R5383 Other fatigue: Secondary | ICD-10-CM | POA: Diagnosis not present

## 2020-10-25 DIAGNOSIS — E559 Vitamin D deficiency, unspecified: Secondary | ICD-10-CM | POA: Diagnosis not present

## 2020-10-25 DIAGNOSIS — Z0000 Encounter for general adult medical examination without abnormal findings: Secondary | ICD-10-CM | POA: Diagnosis not present

## 2020-10-25 DIAGNOSIS — Z7251 High risk heterosexual behavior: Secondary | ICD-10-CM | POA: Diagnosis not present

## 2020-10-25 DIAGNOSIS — T8389XA Other specified complication of genitourinary prosthetic devices, implants and grafts, initial encounter: Secondary | ICD-10-CM | POA: Diagnosis not present

## 2020-10-25 DIAGNOSIS — N949 Unspecified condition associated with female genital organs and menstrual cycle: Secondary | ICD-10-CM | POA: Diagnosis not present

## 2020-10-26 DIAGNOSIS — Z419 Encounter for procedure for purposes other than remedying health state, unspecified: Secondary | ICD-10-CM | POA: Diagnosis not present

## 2020-11-08 DIAGNOSIS — Z79899 Other long term (current) drug therapy: Secondary | ICD-10-CM | POA: Diagnosis not present

## 2020-11-08 DIAGNOSIS — R635 Abnormal weight gain: Secondary | ICD-10-CM | POA: Diagnosis not present

## 2020-11-08 DIAGNOSIS — R0602 Shortness of breath: Secondary | ICD-10-CM | POA: Diagnosis not present

## 2020-11-20 DIAGNOSIS — Z20822 Contact with and (suspected) exposure to covid-19: Secondary | ICD-10-CM | POA: Diagnosis not present

## 2020-11-26 DIAGNOSIS — Z419 Encounter for procedure for purposes other than remedying health state, unspecified: Secondary | ICD-10-CM | POA: Diagnosis not present

## 2020-12-27 DIAGNOSIS — Z419 Encounter for procedure for purposes other than remedying health state, unspecified: Secondary | ICD-10-CM | POA: Diagnosis not present

## 2021-01-11 DIAGNOSIS — N760 Acute vaginitis: Secondary | ICD-10-CM | POA: Diagnosis not present

## 2021-01-11 DIAGNOSIS — Z9189 Other specified personal risk factors, not elsewhere classified: Secondary | ICD-10-CM | POA: Diagnosis not present

## 2021-01-11 DIAGNOSIS — N898 Other specified noninflammatory disorders of vagina: Secondary | ICD-10-CM | POA: Diagnosis not present

## 2021-01-11 DIAGNOSIS — Z3202 Encounter for pregnancy test, result negative: Secondary | ICD-10-CM | POA: Diagnosis not present

## 2021-01-11 DIAGNOSIS — R300 Dysuria: Secondary | ICD-10-CM | POA: Diagnosis not present

## 2021-01-11 DIAGNOSIS — B9689 Other specified bacterial agents as the cause of diseases classified elsewhere: Secondary | ICD-10-CM | POA: Diagnosis not present

## 2021-01-24 DIAGNOSIS — Z419 Encounter for procedure for purposes other than remedying health state, unspecified: Secondary | ICD-10-CM | POA: Diagnosis not present

## 2021-02-24 DIAGNOSIS — Z419 Encounter for procedure for purposes other than remedying health state, unspecified: Secondary | ICD-10-CM | POA: Diagnosis not present

## 2021-03-06 DIAGNOSIS — N760 Acute vaginitis: Secondary | ICD-10-CM | POA: Diagnosis not present

## 2021-03-06 DIAGNOSIS — Z309 Encounter for contraceptive management, unspecified: Secondary | ICD-10-CM | POA: Diagnosis not present

## 2021-03-06 DIAGNOSIS — Z113 Encounter for screening for infections with a predominantly sexual mode of transmission: Secondary | ICD-10-CM | POA: Diagnosis not present

## 2021-03-06 DIAGNOSIS — E669 Obesity, unspecified: Secondary | ICD-10-CM | POA: Diagnosis not present

## 2021-03-06 DIAGNOSIS — Z3049 Encounter for surveillance of other contraceptives: Secondary | ICD-10-CM | POA: Diagnosis not present

## 2021-03-07 DIAGNOSIS — Z113 Encounter for screening for infections with a predominantly sexual mode of transmission: Secondary | ICD-10-CM | POA: Diagnosis not present

## 2021-03-13 DIAGNOSIS — H5213 Myopia, bilateral: Secondary | ICD-10-CM | POA: Diagnosis not present

## 2021-03-26 DIAGNOSIS — Z419 Encounter for procedure for purposes other than remedying health state, unspecified: Secondary | ICD-10-CM | POA: Diagnosis not present

## 2021-04-26 DIAGNOSIS — Z419 Encounter for procedure for purposes other than remedying health state, unspecified: Secondary | ICD-10-CM | POA: Diagnosis not present

## 2021-05-01 DIAGNOSIS — R109 Unspecified abdominal pain: Secondary | ICD-10-CM | POA: Diagnosis not present

## 2021-05-01 DIAGNOSIS — R079 Chest pain, unspecified: Secondary | ICD-10-CM | POA: Diagnosis not present

## 2021-05-02 DIAGNOSIS — R079 Chest pain, unspecified: Secondary | ICD-10-CM | POA: Diagnosis not present

## 2021-05-02 DIAGNOSIS — R109 Unspecified abdominal pain: Secondary | ICD-10-CM | POA: Diagnosis not present

## 2021-05-04 DIAGNOSIS — R109 Unspecified abdominal pain: Secondary | ICD-10-CM | POA: Diagnosis not present

## 2021-05-04 DIAGNOSIS — M461 Sacroiliitis, not elsewhere classified: Secondary | ICD-10-CM | POA: Diagnosis not present

## 2021-05-04 DIAGNOSIS — M545 Low back pain, unspecified: Secondary | ICD-10-CM | POA: Diagnosis not present

## 2021-05-26 DIAGNOSIS — Z419 Encounter for procedure for purposes other than remedying health state, unspecified: Secondary | ICD-10-CM | POA: Diagnosis not present

## 2021-06-09 DIAGNOSIS — R739 Hyperglycemia, unspecified: Secondary | ICD-10-CM | POA: Diagnosis not present

## 2021-06-09 DIAGNOSIS — Z3049 Encounter for surveillance of other contraceptives: Secondary | ICD-10-CM | POA: Diagnosis not present

## 2021-06-09 DIAGNOSIS — G479 Sleep disorder, unspecified: Secondary | ICD-10-CM | POA: Diagnosis not present

## 2021-06-09 DIAGNOSIS — J312 Chronic pharyngitis: Secondary | ICD-10-CM | POA: Diagnosis not present

## 2021-06-09 DIAGNOSIS — Z309 Encounter for contraceptive management, unspecified: Secondary | ICD-10-CM | POA: Diagnosis not present

## 2021-06-09 DIAGNOSIS — E669 Obesity, unspecified: Secondary | ICD-10-CM | POA: Diagnosis not present

## 2021-06-26 DIAGNOSIS — Z419 Encounter for procedure for purposes other than remedying health state, unspecified: Secondary | ICD-10-CM | POA: Diagnosis not present

## 2021-06-28 DIAGNOSIS — G4733 Obstructive sleep apnea (adult) (pediatric): Secondary | ICD-10-CM | POA: Diagnosis not present

## 2021-06-28 DIAGNOSIS — J3501 Chronic tonsillitis: Secondary | ICD-10-CM | POA: Diagnosis not present

## 2021-06-29 DIAGNOSIS — M25561 Pain in right knee: Secondary | ICD-10-CM | POA: Diagnosis not present

## 2021-06-29 DIAGNOSIS — M25562 Pain in left knee: Secondary | ICD-10-CM | POA: Diagnosis not present

## 2021-06-29 DIAGNOSIS — Z833 Family history of diabetes mellitus: Secondary | ICD-10-CM | POA: Diagnosis not present

## 2021-06-29 DIAGNOSIS — S161XXA Strain of muscle, fascia and tendon at neck level, initial encounter: Secondary | ICD-10-CM | POA: Diagnosis not present

## 2021-06-29 DIAGNOSIS — R519 Headache, unspecified: Secondary | ICD-10-CM | POA: Diagnosis not present

## 2021-06-29 DIAGNOSIS — Z79899 Other long term (current) drug therapy: Secondary | ICD-10-CM | POA: Diagnosis not present

## 2021-06-29 DIAGNOSIS — M542 Cervicalgia: Secondary | ICD-10-CM | POA: Diagnosis not present

## 2021-07-25 DIAGNOSIS — Z3049 Encounter for surveillance of other contraceptives: Secondary | ICD-10-CM | POA: Diagnosis not present

## 2021-07-25 DIAGNOSIS — R739 Hyperglycemia, unspecified: Secondary | ICD-10-CM | POA: Diagnosis not present

## 2021-07-25 DIAGNOSIS — E669 Obesity, unspecified: Secondary | ICD-10-CM | POA: Diagnosis not present

## 2021-07-25 DIAGNOSIS — G479 Sleep disorder, unspecified: Secondary | ICD-10-CM | POA: Diagnosis not present

## 2021-07-25 DIAGNOSIS — J312 Chronic pharyngitis: Secondary | ICD-10-CM | POA: Diagnosis not present

## 2021-07-25 DIAGNOSIS — Z309 Encounter for contraceptive management, unspecified: Secondary | ICD-10-CM | POA: Diagnosis not present

## 2021-07-27 DIAGNOSIS — Z419 Encounter for procedure for purposes other than remedying health state, unspecified: Secondary | ICD-10-CM | POA: Diagnosis not present

## 2021-08-26 DIAGNOSIS — Z419 Encounter for procedure for purposes other than remedying health state, unspecified: Secondary | ICD-10-CM | POA: Diagnosis not present

## 2021-09-19 DIAGNOSIS — Z3049 Encounter for surveillance of other contraceptives: Secondary | ICD-10-CM | POA: Diagnosis not present

## 2021-09-19 DIAGNOSIS — R739 Hyperglycemia, unspecified: Secondary | ICD-10-CM | POA: Diagnosis not present

## 2021-09-19 DIAGNOSIS — Z309 Encounter for contraceptive management, unspecified: Secondary | ICD-10-CM | POA: Diagnosis not present

## 2021-09-19 DIAGNOSIS — G479 Sleep disorder, unspecified: Secondary | ICD-10-CM | POA: Diagnosis not present

## 2021-09-19 DIAGNOSIS — J312 Chronic pharyngitis: Secondary | ICD-10-CM | POA: Diagnosis not present

## 2021-09-19 DIAGNOSIS — R079 Chest pain, unspecified: Secondary | ICD-10-CM | POA: Diagnosis not present

## 2021-09-19 DIAGNOSIS — K219 Gastro-esophageal reflux disease without esophagitis: Secondary | ICD-10-CM | POA: Diagnosis not present

## 2021-09-19 DIAGNOSIS — N760 Acute vaginitis: Secondary | ICD-10-CM | POA: Diagnosis not present

## 2021-09-19 DIAGNOSIS — E669 Obesity, unspecified: Secondary | ICD-10-CM | POA: Diagnosis not present

## 2021-09-19 DIAGNOSIS — F17210 Nicotine dependence, cigarettes, uncomplicated: Secondary | ICD-10-CM | POA: Diagnosis not present

## 2021-09-26 DIAGNOSIS — Z419 Encounter for procedure for purposes other than remedying health state, unspecified: Secondary | ICD-10-CM | POA: Diagnosis not present

## 2021-10-04 DIAGNOSIS — R0683 Snoring: Secondary | ICD-10-CM | POA: Diagnosis not present

## 2021-10-04 DIAGNOSIS — Z6841 Body Mass Index (BMI) 40.0 and over, adult: Secondary | ICD-10-CM | POA: Diagnosis not present

## 2021-10-26 DIAGNOSIS — Z419 Encounter for procedure for purposes other than remedying health state, unspecified: Secondary | ICD-10-CM | POA: Diagnosis not present

## 2021-11-08 DIAGNOSIS — F17210 Nicotine dependence, cigarettes, uncomplicated: Secondary | ICD-10-CM | POA: Diagnosis not present

## 2021-11-08 DIAGNOSIS — Z309 Encounter for contraceptive management, unspecified: Secondary | ICD-10-CM | POA: Diagnosis not present

## 2021-11-08 DIAGNOSIS — J312 Chronic pharyngitis: Secondary | ICD-10-CM | POA: Diagnosis not present

## 2021-11-08 DIAGNOSIS — B351 Tinea unguium: Secondary | ICD-10-CM | POA: Diagnosis not present

## 2021-11-08 DIAGNOSIS — G479 Sleep disorder, unspecified: Secondary | ICD-10-CM | POA: Diagnosis not present

## 2021-11-08 DIAGNOSIS — E669 Obesity, unspecified: Secondary | ICD-10-CM | POA: Diagnosis not present

## 2021-11-08 DIAGNOSIS — K219 Gastro-esophageal reflux disease without esophagitis: Secondary | ICD-10-CM | POA: Diagnosis not present

## 2021-11-08 DIAGNOSIS — R739 Hyperglycemia, unspecified: Secondary | ICD-10-CM | POA: Diagnosis not present

## 2021-11-08 DIAGNOSIS — Z3049 Encounter for surveillance of other contraceptives: Secondary | ICD-10-CM | POA: Diagnosis not present

## 2021-11-26 DIAGNOSIS — Z419 Encounter for procedure for purposes other than remedying health state, unspecified: Secondary | ICD-10-CM | POA: Diagnosis not present

## 2021-11-30 DIAGNOSIS — J3501 Chronic tonsillitis: Secondary | ICD-10-CM | POA: Diagnosis not present

## 2021-11-30 DIAGNOSIS — H6691 Otitis media, unspecified, right ear: Secondary | ICD-10-CM | POA: Diagnosis not present

## 2021-12-06 DIAGNOSIS — R0981 Nasal congestion: Secondary | ICD-10-CM | POA: Diagnosis not present

## 2021-12-06 DIAGNOSIS — G4733 Obstructive sleep apnea (adult) (pediatric): Secondary | ICD-10-CM | POA: Diagnosis not present

## 2021-12-06 DIAGNOSIS — Z6841 Body Mass Index (BMI) 40.0 and over, adult: Secondary | ICD-10-CM | POA: Diagnosis not present

## 2021-12-14 DIAGNOSIS — K219 Gastro-esophageal reflux disease without esophagitis: Secondary | ICD-10-CM | POA: Diagnosis not present

## 2021-12-14 DIAGNOSIS — M79642 Pain in left hand: Secondary | ICD-10-CM | POA: Diagnosis not present

## 2021-12-14 DIAGNOSIS — Z309 Encounter for contraceptive management, unspecified: Secondary | ICD-10-CM | POA: Diagnosis not present

## 2021-12-14 DIAGNOSIS — F17210 Nicotine dependence, cigarettes, uncomplicated: Secondary | ICD-10-CM | POA: Diagnosis not present

## 2021-12-14 DIAGNOSIS — G43009 Migraine without aura, not intractable, without status migrainosus: Secondary | ICD-10-CM | POA: Diagnosis not present

## 2021-12-14 DIAGNOSIS — G479 Sleep disorder, unspecified: Secondary | ICD-10-CM | POA: Diagnosis not present

## 2021-12-14 DIAGNOSIS — Z3049 Encounter for surveillance of other contraceptives: Secondary | ICD-10-CM | POA: Diagnosis not present

## 2021-12-14 DIAGNOSIS — H9201 Otalgia, right ear: Secondary | ICD-10-CM | POA: Diagnosis not present

## 2021-12-14 DIAGNOSIS — E669 Obesity, unspecified: Secondary | ICD-10-CM | POA: Diagnosis not present

## 2021-12-14 DIAGNOSIS — J312 Chronic pharyngitis: Secondary | ICD-10-CM | POA: Diagnosis not present

## 2021-12-14 DIAGNOSIS — R739 Hyperglycemia, unspecified: Secondary | ICD-10-CM | POA: Diagnosis not present

## 2021-12-15 DIAGNOSIS — M79604 Pain in right leg: Secondary | ICD-10-CM | POA: Diagnosis not present

## 2021-12-15 DIAGNOSIS — S86911A Strain of unspecified muscle(s) and tendon(s) at lower leg level, right leg, initial encounter: Secondary | ICD-10-CM | POA: Diagnosis not present

## 2021-12-15 DIAGNOSIS — M25561 Pain in right knee: Secondary | ICD-10-CM | POA: Diagnosis not present

## 2021-12-15 DIAGNOSIS — R2241 Localized swelling, mass and lump, right lower limb: Secondary | ICD-10-CM | POA: Diagnosis not present

## 2021-12-15 DIAGNOSIS — S86811A Strain of other muscle(s) and tendon(s) at lower leg level, right leg, initial encounter: Secondary | ICD-10-CM | POA: Diagnosis not present

## 2021-12-15 DIAGNOSIS — Y929 Unspecified place or not applicable: Secondary | ICD-10-CM | POA: Diagnosis not present

## 2021-12-15 DIAGNOSIS — X58XXXA Exposure to other specified factors, initial encounter: Secondary | ICD-10-CM | POA: Diagnosis not present

## 2021-12-27 DIAGNOSIS — Z419 Encounter for procedure for purposes other than remedying health state, unspecified: Secondary | ICD-10-CM | POA: Diagnosis not present

## 2022-01-02 DIAGNOSIS — Z3049 Encounter for surveillance of other contraceptives: Secondary | ICD-10-CM | POA: Diagnosis not present

## 2022-01-02 DIAGNOSIS — E669 Obesity, unspecified: Secondary | ICD-10-CM | POA: Diagnosis not present

## 2022-01-02 DIAGNOSIS — H9201 Otalgia, right ear: Secondary | ICD-10-CM | POA: Diagnosis not present

## 2022-01-02 DIAGNOSIS — F17210 Nicotine dependence, cigarettes, uncomplicated: Secondary | ICD-10-CM | POA: Diagnosis not present

## 2022-01-02 DIAGNOSIS — G43009 Migraine without aura, not intractable, without status migrainosus: Secondary | ICD-10-CM | POA: Diagnosis not present

## 2022-01-02 DIAGNOSIS — M79642 Pain in left hand: Secondary | ICD-10-CM | POA: Diagnosis not present

## 2022-01-02 DIAGNOSIS — R739 Hyperglycemia, unspecified: Secondary | ICD-10-CM | POA: Diagnosis not present

## 2022-01-02 DIAGNOSIS — J312 Chronic pharyngitis: Secondary | ICD-10-CM | POA: Diagnosis not present

## 2022-01-02 DIAGNOSIS — K219 Gastro-esophageal reflux disease without esophagitis: Secondary | ICD-10-CM | POA: Diagnosis not present

## 2022-01-02 DIAGNOSIS — Z309 Encounter for contraceptive management, unspecified: Secondary | ICD-10-CM | POA: Diagnosis not present

## 2022-01-02 DIAGNOSIS — N62 Hypertrophy of breast: Secondary | ICD-10-CM | POA: Diagnosis not present

## 2022-01-15 DIAGNOSIS — G4733 Obstructive sleep apnea (adult) (pediatric): Secondary | ICD-10-CM | POA: Diagnosis not present

## 2022-01-24 DIAGNOSIS — Z79899 Other long term (current) drug therapy: Secondary | ICD-10-CM | POA: Diagnosis not present

## 2022-01-24 DIAGNOSIS — R1032 Left lower quadrant pain: Secondary | ICD-10-CM | POA: Diagnosis not present

## 2022-01-24 DIAGNOSIS — Z419 Encounter for procedure for purposes other than remedying health state, unspecified: Secondary | ICD-10-CM | POA: Diagnosis not present

## 2022-01-24 DIAGNOSIS — R109 Unspecified abdominal pain: Secondary | ICD-10-CM | POA: Diagnosis not present

## 2022-01-30 DIAGNOSIS — G4733 Obstructive sleep apnea (adult) (pediatric): Secondary | ICD-10-CM | POA: Diagnosis not present

## 2022-01-30 DIAGNOSIS — R0981 Nasal congestion: Secondary | ICD-10-CM | POA: Diagnosis not present

## 2022-02-24 DIAGNOSIS — Z419 Encounter for procedure for purposes other than remedying health state, unspecified: Secondary | ICD-10-CM | POA: Diagnosis not present

## 2022-02-27 DIAGNOSIS — E669 Obesity, unspecified: Secondary | ICD-10-CM | POA: Diagnosis not present

## 2022-02-27 DIAGNOSIS — J312 Chronic pharyngitis: Secondary | ICD-10-CM | POA: Diagnosis not present

## 2022-02-27 DIAGNOSIS — K219 Gastro-esophageal reflux disease without esophagitis: Secondary | ICD-10-CM | POA: Diagnosis not present

## 2022-02-27 DIAGNOSIS — R739 Hyperglycemia, unspecified: Secondary | ICD-10-CM | POA: Diagnosis not present

## 2022-02-27 DIAGNOSIS — G43009 Migraine without aura, not intractable, without status migrainosus: Secondary | ICD-10-CM | POA: Diagnosis not present

## 2022-02-27 DIAGNOSIS — G479 Sleep disorder, unspecified: Secondary | ICD-10-CM | POA: Diagnosis not present

## 2022-02-27 DIAGNOSIS — Z309 Encounter for contraceptive management, unspecified: Secondary | ICD-10-CM | POA: Diagnosis not present

## 2022-02-27 DIAGNOSIS — F17210 Nicotine dependence, cigarettes, uncomplicated: Secondary | ICD-10-CM | POA: Diagnosis not present

## 2022-02-27 DIAGNOSIS — Z3049 Encounter for surveillance of other contraceptives: Secondary | ICD-10-CM | POA: Diagnosis not present

## 2022-02-27 DIAGNOSIS — G4730 Sleep apnea, unspecified: Secondary | ICD-10-CM | POA: Diagnosis not present

## 2022-03-26 DIAGNOSIS — Z419 Encounter for procedure for purposes other than remedying health state, unspecified: Secondary | ICD-10-CM | POA: Diagnosis not present

## 2022-04-13 DIAGNOSIS — Z20822 Contact with and (suspected) exposure to covid-19: Secondary | ICD-10-CM | POA: Diagnosis not present

## 2022-04-13 DIAGNOSIS — R059 Cough, unspecified: Secondary | ICD-10-CM | POA: Diagnosis not present

## 2022-04-13 DIAGNOSIS — J039 Acute tonsillitis, unspecified: Secondary | ICD-10-CM | POA: Diagnosis not present

## 2022-04-13 DIAGNOSIS — H6691 Otitis media, unspecified, right ear: Secondary | ICD-10-CM | POA: Diagnosis not present

## 2022-04-20 DIAGNOSIS — G4733 Obstructive sleep apnea (adult) (pediatric): Secondary | ICD-10-CM | POA: Diagnosis not present

## 2022-04-26 DIAGNOSIS — Z419 Encounter for procedure for purposes other than remedying health state, unspecified: Secondary | ICD-10-CM | POA: Diagnosis not present

## 2022-05-19 DIAGNOSIS — L03012 Cellulitis of left finger: Secondary | ICD-10-CM | POA: Diagnosis not present

## 2022-05-26 DIAGNOSIS — G4733 Obstructive sleep apnea (adult) (pediatric): Secondary | ICD-10-CM | POA: Diagnosis not present

## 2022-05-26 DIAGNOSIS — Z419 Encounter for procedure for purposes other than remedying health state, unspecified: Secondary | ICD-10-CM | POA: Diagnosis not present

## 2022-06-26 DIAGNOSIS — G4733 Obstructive sleep apnea (adult) (pediatric): Secondary | ICD-10-CM | POA: Diagnosis not present

## 2022-06-26 DIAGNOSIS — Z419 Encounter for procedure for purposes other than remedying health state, unspecified: Secondary | ICD-10-CM | POA: Diagnosis not present

## 2022-07-11 DIAGNOSIS — Z6841 Body Mass Index (BMI) 40.0 and over, adult: Secondary | ICD-10-CM | POA: Diagnosis not present

## 2022-07-11 DIAGNOSIS — E669 Obesity, unspecified: Secondary | ICD-10-CM | POA: Diagnosis not present

## 2022-07-27 DIAGNOSIS — G4733 Obstructive sleep apnea (adult) (pediatric): Secondary | ICD-10-CM | POA: Diagnosis not present

## 2022-07-27 DIAGNOSIS — Z419 Encounter for procedure for purposes other than remedying health state, unspecified: Secondary | ICD-10-CM | POA: Diagnosis not present

## 2022-08-26 DIAGNOSIS — Z419 Encounter for procedure for purposes other than remedying health state, unspecified: Secondary | ICD-10-CM | POA: Diagnosis not present

## 2022-09-06 DIAGNOSIS — Z6841 Body Mass Index (BMI) 40.0 and over, adult: Secondary | ICD-10-CM | POA: Diagnosis not present

## 2022-09-10 DIAGNOSIS — E669 Obesity, unspecified: Secondary | ICD-10-CM | POA: Diagnosis not present

## 2022-09-10 DIAGNOSIS — F339 Major depressive disorder, recurrent, unspecified: Secondary | ICD-10-CM | POA: Diagnosis not present

## 2022-09-10 DIAGNOSIS — Z1389 Encounter for screening for other disorder: Secondary | ICD-10-CM | POA: Diagnosis not present

## 2022-09-10 DIAGNOSIS — Z3049 Encounter for surveillance of other contraceptives: Secondary | ICD-10-CM | POA: Diagnosis not present

## 2022-09-10 DIAGNOSIS — Z309 Encounter for contraceptive management, unspecified: Secondary | ICD-10-CM | POA: Diagnosis not present

## 2022-09-10 DIAGNOSIS — F17210 Nicotine dependence, cigarettes, uncomplicated: Secondary | ICD-10-CM | POA: Diagnosis not present

## 2022-09-10 DIAGNOSIS — G479 Sleep disorder, unspecified: Secondary | ICD-10-CM | POA: Diagnosis not present

## 2022-09-10 DIAGNOSIS — K219 Gastro-esophageal reflux disease without esophagitis: Secondary | ICD-10-CM | POA: Diagnosis not present

## 2022-09-10 DIAGNOSIS — R739 Hyperglycemia, unspecified: Secondary | ICD-10-CM | POA: Diagnosis not present

## 2022-09-10 DIAGNOSIS — G43009 Migraine without aura, not intractable, without status migrainosus: Secondary | ICD-10-CM | POA: Diagnosis not present

## 2022-09-10 DIAGNOSIS — G4730 Sleep apnea, unspecified: Secondary | ICD-10-CM | POA: Diagnosis not present

## 2022-09-26 DIAGNOSIS — Z419 Encounter for procedure for purposes other than remedying health state, unspecified: Secondary | ICD-10-CM | POA: Diagnosis not present

## 2022-10-11 DIAGNOSIS — F17210 Nicotine dependence, cigarettes, uncomplicated: Secondary | ICD-10-CM | POA: Diagnosis not present

## 2022-10-11 DIAGNOSIS — G43009 Migraine without aura, not intractable, without status migrainosus: Secondary | ICD-10-CM | POA: Diagnosis not present

## 2022-10-11 DIAGNOSIS — G479 Sleep disorder, unspecified: Secondary | ICD-10-CM | POA: Diagnosis not present

## 2022-10-11 DIAGNOSIS — H53143 Visual discomfort, bilateral: Secondary | ICD-10-CM | POA: Diagnosis not present

## 2022-10-11 DIAGNOSIS — F29 Unspecified psychosis not due to a substance or known physiological condition: Secondary | ICD-10-CM | POA: Diagnosis not present

## 2022-10-11 DIAGNOSIS — E669 Obesity, unspecified: Secondary | ICD-10-CM | POA: Diagnosis not present

## 2022-10-11 DIAGNOSIS — R739 Hyperglycemia, unspecified: Secondary | ICD-10-CM | POA: Diagnosis not present

## 2022-10-11 DIAGNOSIS — K219 Gastro-esophageal reflux disease without esophagitis: Secondary | ICD-10-CM | POA: Diagnosis not present

## 2022-10-11 DIAGNOSIS — F419 Anxiety disorder, unspecified: Secondary | ICD-10-CM | POA: Diagnosis not present

## 2022-10-11 DIAGNOSIS — G4730 Sleep apnea, unspecified: Secondary | ICD-10-CM | POA: Diagnosis not present

## 2022-10-11 DIAGNOSIS — F339 Major depressive disorder, recurrent, unspecified: Secondary | ICD-10-CM | POA: Diagnosis not present

## 2022-10-26 DIAGNOSIS — R000 Tachycardia, unspecified: Secondary | ICD-10-CM | POA: Diagnosis not present

## 2022-10-26 DIAGNOSIS — R0789 Other chest pain: Secondary | ICD-10-CM | POA: Diagnosis not present

## 2022-10-26 DIAGNOSIS — Z419 Encounter for procedure for purposes other than remedying health state, unspecified: Secondary | ICD-10-CM | POA: Diagnosis not present

## 2022-10-26 DIAGNOSIS — F29 Unspecified psychosis not due to a substance or known physiological condition: Secondary | ICD-10-CM | POA: Diagnosis not present

## 2022-10-28 DIAGNOSIS — Z91411 Personal history of adult psychological abuse: Secondary | ICD-10-CM | POA: Diagnosis not present

## 2022-10-28 DIAGNOSIS — Z636 Dependent relative needing care at home: Secondary | ICD-10-CM | POA: Diagnosis not present

## 2022-10-28 DIAGNOSIS — R4588 Nonsuicidal self-harm: Secondary | ICD-10-CM | POA: Diagnosis not present

## 2022-10-28 DIAGNOSIS — F323 Major depressive disorder, single episode, severe with psychotic features: Secondary | ICD-10-CM | POA: Diagnosis not present

## 2022-10-28 DIAGNOSIS — Z91410 Personal history of adult physical and sexual abuse: Secondary | ICD-10-CM | POA: Diagnosis not present

## 2022-10-28 DIAGNOSIS — F1010 Alcohol abuse, uncomplicated: Secondary | ICD-10-CM | POA: Diagnosis not present

## 2022-10-28 DIAGNOSIS — F200 Paranoid schizophrenia: Secondary | ICD-10-CM | POA: Diagnosis not present

## 2022-10-28 DIAGNOSIS — Z638 Other specified problems related to primary support group: Secondary | ICD-10-CM | POA: Diagnosis not present

## 2022-10-28 DIAGNOSIS — R45851 Suicidal ideations: Secondary | ICD-10-CM | POA: Diagnosis not present
